# Patient Record
Sex: Female | Born: 1965 | Race: White | Hispanic: No | Marital: Single
Health system: Southern US, Community
[De-identification: ages and names within clinical notes are randomized; demographics above are authoritative.]

## PROBLEM LIST (undated history)

## (undated) DIAGNOSIS — D649 Anemia, unspecified: Secondary | ICD-10-CM

## (undated) DIAGNOSIS — N6310 Unspecified lump in the right breast, unspecified quadrant: Secondary | ICD-10-CM

## (undated) DIAGNOSIS — I241 Dressler's syndrome: Secondary | ICD-10-CM

## (undated) DIAGNOSIS — C539 Malignant neoplasm of cervix uteri, unspecified: Secondary | ICD-10-CM

## (undated) DIAGNOSIS — E785 Hyperlipidemia, unspecified: Secondary | ICD-10-CM

## (undated) DIAGNOSIS — I2510 Atherosclerotic heart disease of native coronary artery without angina pectoris: Secondary | ICD-10-CM

## (undated) DIAGNOSIS — I469 Cardiac arrest, cause unspecified: Secondary | ICD-10-CM

## (undated) DIAGNOSIS — I219 Acute myocardial infarction, unspecified: Secondary | ICD-10-CM

## (undated) DIAGNOSIS — J069 Acute upper respiratory infection, unspecified: Secondary | ICD-10-CM

## (undated) DIAGNOSIS — I2119 ST elevation (STEMI) myocardial infarction involving other coronary artery of inferior wall: Secondary | ICD-10-CM

## (undated) DIAGNOSIS — C801 Malignant (primary) neoplasm, unspecified: Secondary | ICD-10-CM

## (undated) HISTORY — DX: Unspecified lump in the right breast, unspecified quadrant: N63.10

## (undated) HISTORY — DX: Dressler's syndrome: I24.1

## (undated) HISTORY — PX: AORTIC VALVE REPLACEMENT (AVR)/CORONARY ARTERY BYPASS GRAFTING (CABG): SHX5725

## (undated) HISTORY — DX: Atherosclerotic heart disease of native coronary artery without angina pectoris: I25.10

## (undated) HISTORY — DX: Hyperlipidemia, unspecified: E78.5

## (undated) HISTORY — DX: Acute myocardial infarction, unspecified: I21.9

## (undated) HISTORY — PX: TUBAL LIGATION: SHX77

## (undated) HISTORY — PX: BREAST LUMPECTOMY: SHX2

---

## 2004-06-25 HISTORY — PX: CORONARY ARTERY BYPASS GRAFT: SHX141

## 2004-11-17 ENCOUNTER — Inpatient Hospital Stay (HOSPITAL_COMMUNITY): Admission: AD | Admit: 2004-11-17 | Discharge: 2004-12-02 | Payer: Self-pay | Admitting: Internal Medicine

## 2004-11-17 ENCOUNTER — Ambulatory Visit: Payer: Self-pay | Admitting: Cardiology

## 2004-11-17 ENCOUNTER — Ambulatory Visit: Payer: Self-pay | Admitting: Cardiovascular Disease

## 2004-12-11 ENCOUNTER — Inpatient Hospital Stay (HOSPITAL_COMMUNITY): Admission: EM | Admit: 2004-12-11 | Discharge: 2004-12-13 | Payer: Self-pay | Admitting: Emergency Medicine

## 2004-12-12 ENCOUNTER — Encounter (INDEPENDENT_AMBULATORY_CARE_PROVIDER_SITE_OTHER): Payer: Self-pay | Admitting: *Deleted

## 2004-12-12 LAB — CONVERTED CEMR LAB: Triglycerides: 120 mg/dL

## 2004-12-20 ENCOUNTER — Ambulatory Visit: Payer: Self-pay | Admitting: Cardiology

## 2005-02-16 ENCOUNTER — Ambulatory Visit: Payer: Self-pay | Admitting: Cardiology

## 2005-02-20 ENCOUNTER — Ambulatory Visit: Payer: Self-pay | Admitting: Cardiology

## 2005-10-17 ENCOUNTER — Ambulatory Visit: Payer: Self-pay | Admitting: Cardiology

## 2006-07-01 ENCOUNTER — Ambulatory Visit: Payer: Self-pay | Admitting: Cardiology

## 2006-07-05 ENCOUNTER — Ambulatory Visit: Payer: Self-pay | Admitting: Cardiology

## 2006-10-15 ENCOUNTER — Encounter: Admission: RE | Admit: 2006-10-15 | Discharge: 2006-10-15 | Payer: Self-pay | Admitting: Family Medicine

## 2007-04-02 ENCOUNTER — Ambulatory Visit: Payer: Self-pay | Admitting: Cardiovascular Disease

## 2007-04-02 ENCOUNTER — Ambulatory Visit (HOSPITAL_COMMUNITY): Admission: RE | Admit: 2007-04-02 | Discharge: 2007-04-02 | Payer: Self-pay | Admitting: Cardiovascular Disease

## 2007-04-11 ENCOUNTER — Ambulatory Visit: Payer: Self-pay | Admitting: Cardiovascular Disease

## 2007-04-11 ENCOUNTER — Ambulatory Visit (HOSPITAL_COMMUNITY): Admission: RE | Admit: 2007-04-11 | Discharge: 2007-04-11 | Payer: Self-pay | Admitting: Cardiovascular Disease

## 2008-05-19 ENCOUNTER — Ambulatory Visit: Payer: Self-pay | Admitting: Cardiology

## 2008-05-19 DIAGNOSIS — I2510 Atherosclerotic heart disease of native coronary artery without angina pectoris: Secondary | ICD-10-CM | POA: Insufficient documentation

## 2008-05-19 DIAGNOSIS — E785 Hyperlipidemia, unspecified: Secondary | ICD-10-CM

## 2008-05-26 ENCOUNTER — Ambulatory Visit (HOSPITAL_COMMUNITY): Admission: RE | Admit: 2008-05-26 | Discharge: 2008-05-26 | Payer: Self-pay | Admitting: Cardiology

## 2009-07-25 ENCOUNTER — Encounter: Payer: Self-pay | Admitting: Cardiology

## 2009-07-25 ENCOUNTER — Encounter (INDEPENDENT_AMBULATORY_CARE_PROVIDER_SITE_OTHER): Payer: Self-pay | Admitting: *Deleted

## 2009-08-05 ENCOUNTER — Ambulatory Visit: Payer: Self-pay | Admitting: Cardiology

## 2009-08-05 DIAGNOSIS — F17200 Nicotine dependence, unspecified, uncomplicated: Secondary | ICD-10-CM | POA: Insufficient documentation

## 2009-09-06 ENCOUNTER — Encounter (HOSPITAL_COMMUNITY): Admission: RE | Admit: 2009-09-06 | Discharge: 2009-10-06 | Payer: Self-pay | Admitting: Cardiology

## 2009-09-06 ENCOUNTER — Ambulatory Visit: Payer: Self-pay | Admitting: Cardiology

## 2009-09-12 ENCOUNTER — Encounter: Payer: Self-pay | Admitting: Cardiology

## 2009-11-09 ENCOUNTER — Encounter (INDEPENDENT_AMBULATORY_CARE_PROVIDER_SITE_OTHER): Payer: Self-pay | Admitting: *Deleted

## 2010-07-16 ENCOUNTER — Encounter: Payer: Self-pay | Admitting: Cardiology

## 2010-07-25 NOTE — Assessment & Plan Note (Signed)
Summary: PAST DUE FOR F/U/PT HAVING PROBLEMS/TG  Medications Added ASPIR-LOW 81 MG TBEC (ASPIRIN) take 1 tab daily      Allergies Added: NKDA  Visit Type:  Follow-up Primary Provider:  Paulene Floor, NP   History of Present Illness: 45 year old woman presents for a followup visit. She was last seen in November 2009 by Mr. Alben Spittle. She denies any progressive anginal symptoms or unusual shortness of breath. She does indicate that she had a bad "cold" a few weeks ago, during which time she had a spell of dizziness, diaphoresis, and heat. It did remind her somewhat of her remote cardiac symptoms prior to bypass surgery.  She continues to smoke cigarettes, presently at one half pack per day. She has not been able to quit. We discussed several strategies today regarding smoking cessation.  She indicates that she has had difficulty being compliant with her medications, in fact was off of all of them with the exception of aspirin, for proximally one month recently.  She has not had a recent lipid profile or liver function tests.  Current Medications (verified): 1)  Metoprolol Succinate 50 Mg Xr24h-Tab (Metoprolol Succinate) .... Take One Tablet By Mouth Daily 2)  Ramipril 2.5 Mg Caps (Ramipril) .... Take One Capsule By Mouth Daily 3)  Lipitor 20 Mg Tabs (Atorvastatin Calcium) .... Take One Tablet By Mouth Daily. 4)  Aspir-Low 81 Mg Tbec (Aspirin) .... 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  Clinical Review Panels:  Lipid Levels LDL 50  (12/12/2004) HDL 26 (12/12/2004) Cholesterol 100 (12/12/2004)  Carotid Studies Carotid Doppler Results  IMPRESSION:   Mild bilateral carotid bulb plaque without hemodynamically   significant stenosis. The exam does not exclude plaque ulceration   or embolization.  Continued surveillance recommended. (05/26/2008)    Review of Systems  The patient denies anorexia, fever, weight loss, chest pain, syncope, peripheral edema, headaches, hemoptysis, melena, hematochezia, and severe indigestion/heartburn.         Otherwise reviewed and negative.  Vital Signs:  Patient profile:   44 year old female Height:      65 inches Weight:      145 pounds BMI:     24.22 Pulse rate:   69 / minute BP sitting:   118 / 64  (right arm)  Vitals Entered By: Dreama Saa, CNA (August 05, 2009 9:46 AM)  Physical Exam  Additional Exam:  Normally nourished appearing woman in no acute distress. HEENT: Conjunctiva and lids normal, oropharynx with poor dentition. Neck: Supple, no elevated jugular venous pressure or carotid bruits. Lungs: Clear to auscultation, diminished, nonlabored. Cardiac: Regular rate and rhythm, no significant systolic murmur or S3. Abdomen: Soft, nontender, bowel sounds present. Skin: Warm and dry. Musculoskeletal: No kyphosis. Extremities: No pitting edema. Neuropsychiatric: Alert and oriented x3, affect appropriate.   EKG  Procedure date:  08/05/2009  Findings:      Sinus arrhythmia, no significant ST changes.  Impression & Recommendations:  Problem # 1:  CORONARY ATHEROSCLEROSIS NATIVE CORONARY ARTERY (ICD-414.01)  History of 2 vessel obstructive coronary artery disease status post  off-pump bypass surgery in 2006. She has not undergone interval ischemic testing since that time, and did have some vague symptoms a few weeks ago in the setting of a cold like illness. She has recently resumed her medical regimen outlined above. We discussed the importance of smoking  cessation, regular exercise, and compliance with medical therapy and followup. We also discussed proceeding with a surveillance exercise Cardiolite on medical therapy. I will plan a followup visit in 3 months for further discussion, sooner if significant abnormalities are uncovered.  Her updated medication list for this problem includes:    Metoprolol Succinate 50 Mg Xr24h-tab (Metoprolol succinate) .Marland Kitchen... Take one tablet by mouth daily    Ramipril 2.5 Mg Caps (Ramipril) .Marland Kitchen... Take one capsule by mouth daily    Aspir-low 81 Mg Tbec (Aspirin) .Marland Kitchen... Take 1 tab daily  Problem # 2:  CIGARETTE SMOKER (ICD-305.1)  Patient continues to smoke cigarettes. We discussed strategies to assist with smoking cessation, including pharmacologic measures. She will begin to work towards picking a quit date.  Problem # 3:  HYPERLIPIDEMIA (ICD-272.4)  In light of noncompliance with Lipitor, will defer on followup lipid profile and liver function tests, until she has been back on the medicine regularly for 3 months. Labs will be checked prior to her next visit.  Her updated medication list for this problem includes:    Lipitor 20 Mg Tabs (Atorvastatin calcium) .Marland Kitchen... Take one tablet by mouth daily.  Future Orders: T-Lipid Profile [id number]) ... 10/24/2009 T-Hepatic Function [phone]) ... 10/24/2009  Other Orders: Nuclear Stress Test (Nuc Stress Test)  Patient Instructions: 1)  Your physician recommends that you schedule a follow-up appointment in: 3 months 2)  Your physician recommends that you return for lab work in: 3 months 3)  Your physician has requested that you have an exercise stress myoview.  For further information please visit [email or web address]  Please follow instruction sheet, as given. 4)  Your physician recommends that you continue on your current medications as directed. Please refer to the Current Medication list given to you today.  Appended Document: Orders Update Lipids and  LFT's    Clinical Lists Changes  Orders: Added new Test order of T-Lipid Profile [phone]) - Signed Added new Test order of T-Hepatic Function [phone]) - Signed

## 2010-07-25 NOTE — Miscellaneous (Signed)
Summary: toprol,altace lipittor refill  Clinical Lists Changes  Medications: Added new medication of METOPROLOL SUCCINATE 50 MG XR24H-TAB (METOPROLOL SUCCINATE) Take one tablet by mouth daily - Signed Added new medication of RAMIPRIL 2.5 MG CAPS (RAMIPRIL) Take one capsule by mouth daily - Signed Added new medication of LIPITOR 20 MG TABS (ATORVASTATIN CALCIUM) Take one tablet by mouth daily. - Signed Rx of METOPROLOL SUCCINATE 50 MG XR24H-TAB (METOPROLOL SUCCINATE) Take one tablet by mouth daily;  #30 x 0;  Signed;  Entered by: Teressa Lower RN;  Authorized by: Loreli Slot, MD, [facility];  Method used: [id number] [address], [place], [place]  [address], Ph: [phone] or [phone], Fax: [phone] Rx of RAMIPRIL 2.5 MG CAPS (RAMIPRIL) Take one capsule by mouth daily;  #30 x 0;  Signed;  Entered by: Teressa Lower RN;  Authorized by: Loreli Slot, MD, [facility];  Method used: [facility] #[id number]*, [address], [place], [place]  [address], Ph: [phone] or [phone], Fax: [phone] Rx of LIPITOR 20 MG TABS (ATORVASTATIN CALCIUM) Take one tablet by mouth daily.;  #30 x 0;  Signed;  Entered by: Teressa Lower RN;  Authorized by: Loreli Slot, MD, [facility];  Method used: Electronically to [facility]. [id number]*, [address], [place], [place]  [address], Ph: [phone] or [phone], Fax: [phone]    Prescriptions: LIPITOR 20 MG TABS (ATORVASTATIN CALCIUM) Take one tablet by mouth daily.  #30 x 0   Entered by:   Teressa Lower RN   Authorized by:   Loreli Slot, MD, [facility]   Signed by:   Teressa Lower RN on 07/25/2009   Method used:   Electronically to        [facility] [phone]* (retail)       [address] [place]       [place], [place]  [address]       Ph: [phone] or [phone]       Fax: [phone]   RxID:   [id number] RAMIPRIL 2.5 MG CAPS (RAMIPRIL) Take one capsule by mouth daily  #30 x 0  Entered by:   Teressa Lower RN   Authorized by:   Loreli Slot, MD, [facility]   Signed by:   Teressa Lower RN on 07/25/2009   Method used:   Electronically to        [facility] [phone]* (retail)       [address] [place].       [place], [place]  [address]       Ph: [phone] or [phone]       Fax: [phone]   RxID:   [id number] METOPROLOL SUCCINATE 50 MG XR24H-TAB (METOPROLOL SUCCINATE) Take one tablet by mouth daily  #30 x 0   Entered by:   Teressa Lower RN   Authorized by:   Loreli Slot, MD, [facility]   Signed by:   Teressa Lower RN on 07/25/2009   Method used:   Electronically to        [facility] [phone]* (retail)       [address] [place]       [place], [place]  [address]       Ph: [phone] or [phone]       Fax: [phone]   RxID:   [id number]

## 2010-07-25 NOTE — Letter (Signed)
Summary: Appointment - Missed  [facility] [place]  [address], [place] [address]   Phone: [phone]  Fax: [phone]     Nov 09, 2009 MRN: [id number]   [name] [address] [place], [place]  [address]   Dear [name],  Our records indicate you missed your appointment on      11/09/09                  with Dr.  Diona Browner             It is very important that we reach you to reschedule this appointment. We look forward to participating in your health care needs. Please contact us at the number listed above at your earliest convenience to reschedule this appointment.     Sincerely,    Glass blower/designer

## 2010-07-25 NOTE — Miscellaneous (Signed)
Summary: LABS LIPIDS,12/12/2004  Clinical Lists Changes  Observations: Added new observation of LDL: 50 mg/dL (41/32/4401 02:72) Added new observation of HDL: 26 mg/dL (53/66/4403 47:42) Added new observation of TRIGLYC TOT: 120 mg/dL (59/56/3875 64:33) Added new observation of CHOLESTEROL: 100 mg/dL (29/51/8841 66:06)

## 2010-07-25 NOTE — Letter (Signed)
Summary: [place] Results Engineer, agricultural at [facility]  [address] [place] [address]   Phone: [phone]  Fax: [phone]      September 12, 2009 MRN: [id number]   SHALEE PAOLO [address] [place], [place]  [address]   Dear Ms. Laural Benes,  Your test ordered by Selena Batten has been reviewed by your physician (or physician assistant) and was found to be normal or stable. Your physician (or physician assistant) felt no changes were needed at this time.  ____ Echocardiogram  __X__ Cardiac Stress Test  ____ Lab Work  ____ Peripheral vascular study of arms, legs or neck  ____ CT scan or X-ray  ____ Lung or Breathing test  ____ Other: Please continue on current medical treatment.  Thank you.   Nona Dell, MD, F.A.C.C

## 2010-07-25 NOTE — Letter (Signed)
Summary: [place] Future Lab Work Engineer, agricultural at [facility]  [address] [place] [address]   Phone: [phone]  Fax: [phone]     August 05, 2009 MRN: [id number]   [name] [address] [place], [place]  [address]      YOUR LAB WORK IS DUE  Oct 24, 2009 _________________________________________  Please go to [facility], located across the street from [facility] on the second floor.  Hours are Monday - Friday 7am until 7:30pm         Saturday 8am until 12noon    __  DO NOT EAT OR DRINK AFTER MIDNIGHT EVENING PRIOR TO LABWORK  __ YOUR LABWORK IS NOT FASTING --YOU MAY EAT PRIOR TO LABWORK

## 2010-07-25 NOTE — Letter (Signed)
Summary: [name] (Nuc Med Stress)  [facility]  [address] [place] [address]   Phone: [phone]  Fax: [phone]    Nuclear Medicine 1-Day Stress Test Information Sheet  Re:     [name]   DOB:     15-Jun-1966 MRN:     [id number] Weight:  Appointment Date: Register at: Appointment Time: Referring MD:  _X__Exercise Stress  __Adenosine   __Dobutamine  __Lexiscan  __Persantine   __Thallium  Urgency: ____1 (next day)   ____2 (one week)    ____3 (PRN)  Patient will receive Follow Up call with results: Patient needs follow-up appointment:  Instructions regarding medication:  How to prepare for your stress test: 1. DO NOT eat or dring 6 hours prior to your arrival time. This includes no caffeine (coffee, tea, sodas, chocolate) if you were instructed to take your medications, drink water with it. 2. DO NOT use any tobacco products for at leaset 8 hours prior to arrival. 3. DO NOT wear dresses or any clothing that may have metal clasps or buttons. 4. Wear short sleeve shirts, loose clothing, and comfortalbe walking shoes. 5. DO NOT use lotions, oils or powder on your chest before the test. 6. The test will take approximately 3-4 hours from the time you arrive until completion. 7. To register the day of the test, go to the Short Stay entrance at [facility]. 8. If you must cancel your test, call [phone] as soon as you are aware.  After you arrive for test:   When you arrive at [facility], you will go to Short Stay to be registered. They will then send you to Radiology to check in. The Nuclear Medicine Tech will get you and start an IV in your arm or hand. A small amount of a radioactive tracer will then be injected into your IV. This tracer will then have to circulate for 30-45 minutes. During this time you will wait in the waiting room and you will be able to drink something without caffeine. A series of pictures will be taken  of your heart follwoing this waiting period. After the 1st set of pictures you will go to the stress lab to get ready for your stress test. During the stress test, another small amount of a radioactive tracer will be injected through your IV. When the stress test is complete, there is a short rest period while your heart rate and blood pressure will be monitored. When this monitoring period is complete you will have another set of pictrues taken. (The same as the 1st set of pictures). These pictures are taken between 15 minutes and 1 hour after the stress test. The time depends on the type of stress test you had. Your doctor will inform you of your test results within 7 days after test.    The possibilities of certain changes are possible during the test. They include abnormal blood pressure and disorders of the heart. Side effects of persantine or adenosine can include flushing, chest pain, shortness of breath, stomach tightness, headache and light-headedness. These side effects usually do not last long and are self-resolving. Every effort will be made to keep you comfortable and to minimize complications by obtaining a medical history and by close observation during the test. Emergency equipment, medications, and trained personnel are available to deal with any unusual situation which may arise.  Please notify office at least 48 hours in advance if you are unable to keep  this appt.

## 2010-10-19 ENCOUNTER — Encounter: Payer: Self-pay | Admitting: Cardiology

## 2010-10-19 ENCOUNTER — Ambulatory Visit: Payer: Self-pay | Admitting: Cardiology

## 2010-10-23 ENCOUNTER — Encounter: Payer: Self-pay | Admitting: Cardiology

## 2010-11-07 NOTE — Assessment & Plan Note (Signed)
[facility]                       [place] CARDIOLOGY OFFICE NOTE   NAME:[name], [name]                     MRN:          [id number]  DATE:04/02/2007                            DOB:          21-Jul-1965    Shine returns today for followup.  She is actually a patient of Dr.  Andee Lineman who had previously been seen in [place].  She is the sister-in-law  of one of our [address] [place].   I actually did a heart cath on the patient back in 2006.  At that time,  she presented with a subendocardial MI.  She had severe 2-vessel disease  including the LAD and a sub-totally occluded right.  She had failed  angioplasty of the right and required bypass surgery by Dr. Jaynie Collins.  She, unfortunately, has been noncompliant with her meds.  She has only  been taking an aspirin.  She also continues to smoke a pack a day.   She has been under a lot of stress lately, both in terms of neighborhood  issues, her live-in boyfriend, Brett Canales, and in general, the economy.   She has had significant chest pain.  On September 14, she had a very bad  episode with radiation down both arms, nausea, diaphoresis, and also  radiation to the back.  She thinks she had another MI.   She has had occasional chest pains outside of this as well.  Her last  Myoview was done by Dr. Andee Lineman for chest pain syndrome in January of  this year.  She had a small apical defect with partial reversibility.  He though this was stable and medical therapy was warranted.   Her overall EF was preserved at 64%.   The patient's review of systems is otherwise remarkable for no PND or  orthopnea, no exertional dyspnea.  No palpitations or syncope.   She does not have nitroglycerin to take at home.  The bad episode of  pain that she had on September 14 lasted for about 3 hours.   I had a long discussion with Steward Drone.  I told her that, given her lack of  medical compliance, her smoking, recurrent pain, and the fact that  her  circ was never bypassed, she should probably have a heart  catheterization.  She agreed.  The risk, including stroke, need for  emergency surgery, bleeding, dye reaction were discussed.  She is  willing to proceed.   We will get her blood work and chest x-ray today.   REVIEW OF SYSTEMS:  Otherwise, negative.   MEDICATIONS:  Supposed to include:  1. Toprol 25 a day.  2. Altace 2.5 a day.  3. An aspirin a day.  4. Lipitor 40 a day.   I suspect that she would not be compliant with Plavix due to the cost.   The patient is not married.  She lives with her boyfriend named Brett Canales  who has been with her for 7 years.  They have a 46-year-old daughter  named Jon Gills whom they raise.  She works at [employer] called [place] [employer] in [place].  She smokes a pack a day  and drinks  occasionally.   As indicated, she has not taken any meds for the last 2 months.  She has  no known drug allergies.   FAMILY HISTORY:  Positive for diabetes and coronary artery disease in  both sides of the family.  Both of her parents have bene dead for at  least 13 years.   EXAM:  Remarkable for a middle-aged white female in no distress.  Affect  is appropriate.  Weight is 150, respiratory rate is 14, blood pressure 140/80, pulse 74  and regular.  HEENT:  Normal.  Carotids normal without bruit.  There is no lymphadenopathy,  thyromegaly, or JVP elevation.  LUNGS:  Clear.  Good diaphoretic motion.  No wheezing.  There is an S1, S2 with normal heart sounds.  PMI is normal.  She has a  scar over the right breast from previous lumpectomy.  Bowel sounds  positive.  No tenderness.  No hepatosplenomegaly.  No hepatojugular  reflux.  Nontender.  No bruit.  Femorals are +3 bilaterally without bruit.  PT +3.  There is no lower  extremity edema.  NEURO:  Nonfocal.  There is no muscular weakness.   Her baseline EKG has nonspecific T wave changes in II, III, and F.   IMPRESSION:  1. Chest pain,  particularly bad episode on September 14.  Possible      subendocardial myocardial infarction versus angina.  Followup      catheterization to be done in [facility].  Continue aspirin.  Given the      issues with her noncompliance, it would probably not be worthwhile      to load her with Plavix.  Will see what her coronaries look like      before doing this.  2. Hyperlipidemia.  Will give her a prescription for generic      simvastatin to see if she can take this with a $10 prescription      from [facility].  We will check fasting liver and lipid profiles      prior to her heart cath.  3. Hypertension.  Currently borderline control,  Not surprising given      the lack of dietary salt restriction and not taking her Altace.      Again, we will try to give her a generic prescription for ramipril      and see if she will take this.  4. Previous history of lumpectomy in the right breast.  Need for      followup MRI or mammogram.  I have encouraged her to follow up with      [facility] for this.  5. Smoking cessation.  Again, I have told Loie that this is a real      problem.  She did not want Chantix for this.  She understands the      risk of continued smoking.   The patient seems to smoke in relation to her stress levels, which have  been high.  We will do the best we can to see if we can get her to stop  and possibly give her some generic Wellbutrin.  Further recommendations  will be based on the results of her lab work and cath.     Noralyn Pick. Eden Emms, MD, [facility]  Electronically Signed    PCN/MedQ  DD: 04/02/2007  DT: 04/02/2007  Job #: [id number]

## 2010-11-07 NOTE — Cardiovascular Report (Signed)
[name], [name]              ACCOUNT NO.:  [id number]   MEDICAL RECORD NO.:  [id number]          PATIENT TYPE:  OIB   LOCATION:  2899                         FACILITY:  [facility]   PHYSICIAN:  Denise Fells. Excell Seltzer, MD  DATE OF BIRTH:  [birth year]   DATE OF PROCEDURE:  04/11/2007  DATE OF DISCHARGE:  04/11/2007                            CARDIAC CATHETERIZATION   PROCEDURE:  1. Left heart catheterization.  2. Selective coronary angiography.  3. Left ventricular angiography.  4. Saphenous vein graft angiography.  5. Left internal mammary artery angiography.  6. Distal aortic angiography.  7. StarClose of the right femoral artery.   INDICATIONS:  [name] is a 45 year old woman with known CAD.  She  initially presented to 2006 with a non-ST-elevation MI.  She had severe  two-vessel disease and was treated with coronary bypass which involved  LIMA to LAD and saphenous vein graft to distal right coronary artery.  She has had episodes of chest discomfort that are typical of her angina  and was referred for cardiac catheterization in the setting of her known  CAD.   Risks and indications of the procedure were reviewed with the patient.  Informed consent was obtained.  The right groin was prepped, draped,  anesthetized with 1% lidocaine. Using the modified Seldinger technique,  a 6-French sheath was placed in the right femoral artery.  Standard 6-  French Judkins catheters were used for native coronary angiography.  The  JR-4 catheter was then used to image the saphenous vein graft to distal  right coronary artery as well as the left internal mammary artery.  I  had difficulty selectively engaging the left internal mammary with the  JR-4 and ultimately changed out over an exchange length wire to a 5-  [place] LIMA catheter.  Selective LIMA angiography was then performed.  An angled pigtail catheter was used to perform left ventriculography as  well as a pullback across the aortic valve.   The pigtail was then pulled  down to the abdominal aorta where a distal aortogram was performed.  At  the completion of the procedure, a StarClose device was used to seal the  femoral arteriotomy.  The patient tolerated the procedure well and had  no immediate complications.  All catheter exchanges were performed over  a guidewire.   FINDINGS:  Aortic pressure 117/62 with a mean of 86. Left ventricular  pressure is 117/8.   CORONARY ANGIOGRAPHY:  The left mainstem is angiographically normal.  It  trifurcates into the LAD, left circumflex and a ramus intermedius.   The LAD is a large-caliber vessel that courses down and reaches the LV  apex.  Proximal LAD has an eccentric 99% stenosis.  The LAD supplies a  medium-size first diagonal branch.  Just beyond the diagonal branches is  the insertion site of the LIMA to LAD.  The mid and distal LAD fill  competitively from native vessel and LIMA flow.   The intermediate branch is of medium caliber.  It is widely patent.  There is some minor nonobstructive disease in the mid portion of the  intermediate.  The left circumflex is relatively small.  There is a small first OM  branch. In the AV groove circumflex beyond the OM is an even smaller  vessel that supplies a tiny left posterolateral branch.   The right coronary artery is severely diseased.  It is a small vessel  throughout its proximal portion, and it is totally occluded in the mid  portion.  Just before the total occlusion, the right coronary supplies  two acute marginal branches to the right ventricle.   Saphenous vein graft to the distal right coronary artery is widely  patent.  There are no significant stenoses throughout the graft.  The  graft appears to supply the PDA as well as two right posterolateral  branches.  There is no significant angiographic stenosis throughout the  branch vessels of the right coronary artery.   The LIMA to LAD is widely patent. The distal to mid  distal LAD have no  significant angiographic stenosis beyond the LIMA insertion.   Left ventricular function is normal.  The LVEF is 60%.  There is no  mitral regurgitation.   Abdominal aortography demonstrates widely patent single renal arteries  bilaterally.  The distal abdominal aorta has diffuse nonobstructive  plaquing and is a relatively small vessel.  The common iliac arteries  are patent bilaterally.   ASSESSMENT:  1. Severe native two vessel coronary artery disease involving the left      anterior descending and right coronary artery.  2. Nonobstructive left circumflex stenosis.  3. Patent left internal mammary artery to left anterior descending.  4. Patent saphenous vein graft to distal right coronary artery.  5. Normal left ventricular function.  6. Mild distal abdominal aortic atherosclerosis.   PLAN:  Recommend ongoing medical therapy and tobacco cessation.  The  patient was given a prescription for Chantix to assist with smoking  cessation.      Denise Fells. Excell Seltzer, MD  Electronically Signed     MDC/MEDQ  D:  04/11/2007  T:  04/13/2007  Job:  [id number]   cc:   Denise Pick. Eden Emms, MD, [facility]

## 2010-11-07 NOTE — Assessment & Plan Note (Signed)
[facility]                       [place] CARDIOLOGY OFFICE NOTE   NAME:[name], [name]                     MRN:          [id number]  DATE:05/19/2008                            DOB:          1966-03-03    CARDIOLOGIST:  Previously was Learta Codding, MD, [facility], then Noralyn Pick.  Eden Emms, MD, [facility].  Now, [name] is established with [name].   PRIMARY CARE PHYSICIAN:  [name] is followed at [facility].   REASON FOR VISIT:  One-year followup.   HISTORY OF PRESENT ILLNESS:  [name] is a very pleasant 45 year old  female patient with a history of coronary artery disease status post  CABG in 2006 in the setting of non-ST-elevation myocardial infarction.  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  [name] underwent stress  testing in [place] in January 2008 after presenting to Dr. Andee Lineman with  chest pain.  This showed no ischemia.  [name] then saw Dr. Eden Emms in  October 2008, with another episode of chest discomfort and was referred  for cardiac catheterization.  This was done by Dr. Excell Seltzer and  demonstrated severe two-vessel CAD.  Her LAD was 99% stenosed in the  proximal section and her RCA was totally occluded in the mid section.  Her circumflex was small without significant obstruction.  Her  intermediate branch was widely patent with minor nonobstructive disease.  Her vein graft to the distal RCA and LIMA to LAD were both widely  patent.  Her EF was 60%.  [name] had some distal abdominal aorta plaquing.  Her renal arteries were patent.  [name] was treated medically.   In the office today, [name] notes that [name] is overall doing well.  [name] did  have an episode of chest discomfort about a week ago when [name] was on  vacation with a school field trip.  They were in [place], [place] and [name]  had walked for several hours prior to the episode of chest discomfort.  This was a sharp substernal pain.  There was no pressure or heaviness.  [name]  denies any associated nausea, diaphoresis, arm or jaw pain or  shortness of breath.  Her symptoms were unlike her previous angina.  [name]  has not had a recurrence since then.  [name] is quite active without any  exertional chest pain or shortness of breath.   CURRENT MEDICATIONS:  Toprol-XL 25 mg daily, Altace 2.5 mg daily,  Aspirin 81 mg daily, Lipitor 20 mg daily, Butalbital/APAP p.r.n.  headaches.   ALLERGIES:  No known drug allergies.   SOCIAL HISTORY:  [name] continues to smoke cigarettes at a pack per day.   PHYSICAL EXAMINATION:  GENERAL:  [name] is a well-nourished, well-developed  female in no acute distress.  VITAL SIGNS:  Blood pressure is 120/78, pulse 86, weight 147 pounds.  HEENT:  Normal neck without JVD.  CARDIAC:  Normal S1 and S2.  Regular rate and rhythm.  LUNGS:  Clear to auscultation bilaterally, soft, nontender.  EXTREMITIES:  Without edema.  NEUROLOGIC:  [name] is alert and oriented x3.  Cranial nerves II through  XII are grossly intact.  Patrick's exam shows a questionable carotid  bruit over the right neck.   Electrocardiogram revealed sinus rhythm with a heart rate of 86, normal  axis, nonspecific ST-T wave changes.  No significant change since  previous tracing.   ASSESSMENT AND PLAN:  1. Coronary artery disease status post non-ST elevation myocardial      infarction in 2006, followed by subsequent CABG x2 with a recent      cardiac catheterization demonstrating patent grafts and preserved      left ventricular function.  [name] had an episode of chest discomfort      recently; it was very atypical.  As noted above, [name] had a      nonischemic nuclear study last January.  I do not feel that [name]      needs further ischemic testing at this time.  We we will try to      adjust her medications by increasing her Toprol to 50 mg a day.      [name] will continue on aspirin and her other medications as listed.      We will see her back in follow up to review her symptoms.  2.  Hypertension is overall well-controlled.  [name] will continue her      medications as listed above.  3. Dyslipidemia.  Her goal LDL less than or equal to 70.  We will      follow up with fasting lipids and LFTs.  4. Questionable right carotid bruit.  We will check carotid Dopplers      and try to obtain pre CABG Dopplers done in 2006 from [facility].  5. Tobacco abuse.  I counseled her on tobacco cessation again today.      [name] is interested on Chantix and wants to call the office when [name]      is ready to start this medication.   DISPOSITION:  The patient will be brought back in followup with myself  or Dr. Diona Browner in next 1 month or sooner p.r.n. to reassess her  symptoms and make further recommendations if necessary.      Tereso Newcomer, PA-C  Electronically Signed      Jonelle Sidle, MD  Electronically Signed   SW/MedQ  DD: 05/19/2008  DT: 05/19/2008  Job #: [id number]   cc:   [facility]

## 2010-11-10 NOTE — Assessment & Plan Note (Signed)
[facility]                          [place] CARDIOLOGY OFFICE NOTE   NAME:Sopp, BREANN LOSANO                     MRN:          [id number]  DATE:07/01/2006                            DOB:          04-22-1966    HISTORY OF PRESENT ILLNESS:  The patient is a 45 year old female with a  history of coronary artery disease status post coronary artery bypass  grafting x2.  The patient underwent bypass grafting in June of 2006.  The patient, unfortunately, continues to smoke.  She has not seen a  primary care doctor in the interim.  She did take care of a right-sided  breast mass and has seen Dr. Cleotis Nipper.   She now reports left-sided chest pain that occurred after sweeping and  mopping the floor.  This happened approximately a few weeks ago.  She  states the pain lasted for approximately an hour, and she continues to  be somewhat tender to the touch.  The pain is greatly improved, however.  There is no clear exertional component.  She denies any shortness of  breath, sweatiness, or palpitations.   CURRENT MEDICATIONS:  1. Toprol XL 25 mg p.o. daily.  2. Altace 2.5 mg p.o. daily.  3. Enteric-coated aspirin 81 mg p.o. daily.  4. Lipitor 40 mg p.o. nightly.   PHYSICAL EXAMINATION:  VITAL SIGNS:  Blood pressure 124/82, heart rate  68 beats per minute, weight is 149 pounds.  NECK:  Normal carotid upstroke and no carotid bruits.  LUNGS:  Clear breath sounds bilaterally.  HEART:  Regular rate and rhythm with a normal S1, S2.  No murmurs, rubs,  or gallops.  ABDOMEN:  Soft and nontender.  No rebound tenderness or guarding.  Good  bowel sounds.  EXTREMITIES:  No cyanosis, clubbing, or edema.   A 12-lead electrocardiogram normal sinus rhythm with no acute ischemic  change.   PROBLEM LIST:  1. Coronary artery disease.      a.     Status post coronary artery bypass grafting x2 [left       internal mammary artery to the left anterior descending, saphenous  vein graft to posterior descending artery in 2006].      b.     Status post postpericardiotomy syndrome.      c.     Recurrent chest pain atypical.  2. Tobacco use.  3. Dyslipidemia.  4. Noncompliance.  5. Right breast mass, followed by Dr. Cleotis Nipper.   PLAN:  1. I counseled the patient regarding tobacco use.  She will try to      discontinue this.  2. The patient will need an adenosine Cardiolite study for further      evaluation, and I have also      ordered laboratory work, including lipid panel and LFTs.  3. The patient will follow up with [place] in 6 months.  Overall, I do      think her chest pain is probably non-cardiac.     Learta Codding, MD,FACC  Electronically Signed    GED/MedQ  DD: 07/01/2006  DT: 07/01/2006  Job #: [id number]

## 2010-11-10 NOTE — Discharge Summary (Signed)
[name], [name]              ACCOUNT NO.:  [id number]   MEDICAL RECORD NO.:  [id number]          PATIENT TYPE:  INP   LOCATION:  2038                         FACILITY:  [facility]   PHYSICIAN:  Sheliah Plane, MD    DATE OF BIRTH:  Oct 22, 1965   DATE OF ADMISSION:  11/17/2004  DATE OF DISCHARGE:                                 DISCHARGE SUMMARY   ADMISSION DIAGNOSIS:  Acute myocardial infarction.   PAST MEDICAL HISTORY/DISCHARGE DIAGNOSES:  1.  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  2.  Coronary artery disease, status post acute myocardial infarction, status      post coronary artery bypass grafting x2 off pump.  3.  Postoperative anemia, stable.   ALLERGIES:  No known drug allergies.   BRIEF HISTORY:  The patient is a 45 year old female with a long-standing  history of tobacco abuse, who presented to the emergency room at [facility] with a reported two to three-week history of episodic chest  discomfort with exertion that was nonradiating.  She then developed a  prolonged episode of substernal chest pain radiating to both arms with  exertional shortness of breath which prompted her to present to the ED.  In  the emergency room, she was found to have T wave inversions inferiorly, and  her cardiac troponins were positive.  The patient was given 600 mg of Plavix  and was started on aspirin, Lovenox, and a beta blocker, and then  transferred to [facility] for cardiac catheterization.   HOSPITAL COURSE:  The patient was admitted to [facility] on Nov 17, 2004, for cardiac catheterization.  She had been previously found in  [place] to have suffered an acute myocardial infarction.  The patient  underwent cath on Nov 17, 2004, and this revealed multivessel coronary  artery disease.  At that point, it was recommended that the patient proceed  with angioplasty to the LAD, and Plavix was continued.  However, a repeat  cardiac catheterization was performed, and  the decision was made to abandon  the idea of angioplasty and consult the CVT service regarding surgical  revascularization.   Dr. Tyrone Sage of the CVT service consulted on the patient on Nov 19, 2004,  and it was his opinion that the patient should proceed with coronary artery  bypass graft surgery, however, because the patient had been loaded with  Plavix and maintained on it for several days, the patient would require a  five to seven-day waiting period before proceeding with surgery in order to  wait for Plavix washout.  In the interim, Dr. Ezzard Standing of the general surgery  service was consulted regarding the patient's right breast mass which she  stated had been found to be benign in the past, however, had been enlarging.  Dr. Ezzard Standing evaluated the patient on Nov 22, 2004, and it was his opinion  that this was likely a fibroadenoma.  He obtained the pathology report from  [facility], and this was confirmed.  This was certainly secondary to  the patient's cardiac disease and was stated to be addressed as an  outpatient.  Dr. Ezzard Standing has agreed to see the patient in six to eight weeks  after discharge.   The patient was maintained on routine hospital care and was pain-free in  anticipation of surgery while the Plavix washout occurred.   The patient was noted to have iron deficiency anemia preoperatively, and was  started on iron supplementation.  Guaiac studies were negative.   The patient was taken to the OR on November 28, 2004, for an off-pump coronary  artery bypass grafting x2.  Endoscopic vessel harvesting was performed on  the right lower extremity.  The left internal mammary artery was grafted to  the LAD, and saphenous vein was grafted to the PDA.  The patient tolerated  the procedure well and was hemodynamically stable immediately  postoperatively.  The patient was transferred from the OR to the SICU in a  stable condition.  The patient was extubated without complication and  woke  up from anesthesia neurologically intact.   On postoperative day #1, the patient had a mild temperature of 100.  She was  awake and alert without complaint.  She was maintained in normal sinus  rhythm and had stable vital signs.  Invasive lines and chest tubes were  discontinued in a routine manner.  She tolerated this well.  The patient has  been ambulated postoperatively and has been diuresed accordingly.  She began  cardiac rehab on postoperative day #1, and has increased her tolerance to a  satisfactory level.   The patient has been anemic postoperatively as well.  On postoperative day  #2, she was noted to have a hemoglobin of 7.4.  She has been continued on  iron and folic acid supplementation.  This is being followed closely.  On  postoperative day #3, the patient is in a stable condition.  She has  maintained a normal sinus rhythm and is afebrile.  Her lungs are clear, and  the wounds are healing well.  The patient is still requiring oxygen via  nasal cannula.  Her saturations are good at this time.  The patient is in a  stable condition at this time.  As long as she continues to progress in the  current manner, she will be ready for discharge in the next one to two days  pending morning round reevaluation, anemia stabilization, and  discontinuation of oxygen.   LABORATORY DATA:  CBC and BMP on November 30, 2004:  Hemoglobin 7.4, hematocrit  23.4, white count 6.9, platelets 181,000.  Sodium 137, potassium 4.1, BUN 6,  creatinine 0.7, glucose 122.   CONDITION ON DISCHARGE:  Improved.   DISCHARGE MEDICATIONS:  1.  Aspirin 81 mg daily.  2.  Toprol-XL 25 mg daily.  3.  Altace 2.5 mg daily.  4.  Lipitor 20 mg daily.  5.  Plavix 75 mg daily.  6.  Folic acid 1 mg daily.  7.  Iron 325 mg b.i.d.  8.  Tylox 1-2 q.4-6h. p.r.n. for pain.   ACTIVITY:  No driving.  No lifting more than 10 pounds.  The patient should continue daily breathing and walking exercises.   DIET:  Low  salt, low fat.   WOUND CARE:  The patient may shower daily and clean the incisions with soap  and water.  If wound problems arise, she should contact the CVT office at  [phone].   FOLLOW-UP APPOINTMENTS:  1.  With Dr. Andee Lineman.  She will be instructed to contact his office for an      appointment two weeks  after discharge.  A chest x-ray will be taken at      that time, which the patient will bring with her to the follow-up      appointment with Dr. Tyrone Sage.  2.  With Dr. Tyrone Sage on December 28, 2004, at 12 p.m.  3.  Dr. Ezzard Standing.  The patient should contact his office for an appointment in      six to eight weeks after discharge for followup of right breast      fibroadenoma.       AY/MEDQ  D:  12/01/2004  T:  12/02/2004  Job:  [id number]   cc:   Learta Codding, M.D. [facility]   Sheliah Plane, MD  [address]  [place]  [place] [address]  Email: [email or web address] Boone. Ezzard Standing, M.D.  [address]  [place]  [place] [address]

## 2010-11-10 NOTE — Op Note (Signed)
[name], [name]              ACCOUNT NO.:  [id number]   MEDICAL RECORD NO.:  [id number]          PATIENT TYPE:  INP   LOCATION:  2038                         FACILITY:  [facility]   PHYSICIAN:  Sheliah Plane, MD    DATE OF BIRTH:  01/12/1966   DATE OF PROCEDURE:  11/28/2004  DATE OF DISCHARGE:                                 OPERATIVE REPORT   PREOPERATIVE DIAGNOSIS:  Coronary occlusive disease.   POSTOPERATIVE DIAGNOSIS:  Coronary occlusive disease.   SURGICAL PROCEDURE:  Off-pump coronary artery bypass grafting x 2, with the  left internal mammary to the left anterior descending coronary artery, and  reverse saphenous vein graft to the posterior descending coronary artery,  right Endovein harvesting.   SURGEON:  Sheliah Plane, MD.   FIRST ASSISTANT:  Stephanie Acre. Dominick, P.A.   BRIEF HISTORY:  The patient is a 45 year old female who had been placed on  Plavix in preparation for angioplasty.  She underwent cardiac  catheterization, and decision by cardiology was to proceed with angioplasty  of the LAD.  At a separate setting, angioplasty attempt was started.  However, after repeat catheterization it was decided that technically this  was not suitable, and a surgical consultation was obtained.  The patient had  total occlusion of the right coronary artery and high-grade proximal LAD  stenosis.  The circumflex was relatively free of disease.  Coronary artery  bypass grafting was recommended to the patient.  Because she had been loaded  and maintained on Plavix, surgery was delayed for Plavix washout.  The  patient agreed and signed informed consent.  There was some question of  subclavian stenosis in the patient.  CT scan of this showed some  calcification in the proximal left subclavian, but no evidence of high-grade  stenosis.   DESCRIPTION OF THE PROCEDURE:  With Swan-Ganz and arterial line monitors in  place, the patient underwent general endotracheal anesthesia  without  incident.  The skin of the chest and legs was prepped with Betadine and  draped in the usual sterile manner.  Using the Guidant Endovein harvesting  system, vein was harvested from the right thigh and was of good quality and  caliber.  Median sternotomy was performed.  Left internal mammary artery was  dissected down as a pedicle graft.  The distal artery was divided and had  good free flow.  Pericardium was opened.  Overall ventricular function  appeared preserved.  Using the Guidant stabilization devices, it was decided  that satisfactory hemodynamics could be obtained and exposure to perform the  procedure off pump.  The patient was systemically heparinized.  Using the  octopus, the heart was slightly elevated.  The LAD was identified.  Retractor tapes were placed proximally and distally on the area of  anticipated anastomosis.  A Beaver blade was used to open the artery.  Retractor tapes controlled bleeding.  Using a running 8-0 Prolene, the left  internal mammary artery was anastomosed to the left anterior descending  coronary artery.  The bulldog was reduced on the mammary artery, and  retractor tapes were removed.  Site of anastomosis  was free of bleeding.  The fascia was tacked to the epicardium.  The retractors were then removed  and repositioned to allow exposure of the proximal part of the posterior  descending coronary artery.  This vessel was encircled with retractor tapes  also. The vessel was opened and was small, 1.2-1.3 mm in size.  Using a  running 7-0 Prolene, distal anastomosis was performed.  A partial occlusion  clamp was placed on the ascending aorta.  A single punch aortotomy was  performed, and the vein graft to the posterior descending coronary artery  was anastomosed to the ascending aorta.  Air was evacuated from the grafts  as the partial occlusion clamp was removed.  Sites of anastomosis were  inspected and free of bleeding.  Ventricular and atrial  pacing wires were  applied.  Graft marker was applied.  The patient had the pericardium  reapproximated.  Two Silastic mediastinal tubes were left in place.  Sternum  was closed with #6 stainless steel wire.  Fascia was closed with interrupted  0 Vicryl, running 3-0 Vicryl in the subcutaneous tissue, 4-0 subcuticular  stitch in the skin edges.  Dry dressings were applied.  Sponge and needle  count was reported as correct at the completion of the procedure.  The  patient tolerated the procedure without obvious complication and was  transferred to the surgical intensive care unit for further postoperative  care.       EG/MEDQ  D:  11/29/2004  T:  11/29/2004  Job:  [id number]   cc:   Salvadore Farber, M.D. [facility]  [address]  [address]  [place]  [place] [address]

## 2010-11-10 NOTE — Cardiovascular Report (Signed)
[name], [name]              ACCOUNT NO.:  [id number]   MEDICAL RECORD NO.:  [id number]          PATIENT TYPE:  INP   LOCATION:  2021                         FACILITY:  [facility]   PHYSICIAN:  Charlton Haws, M.D.     DATE OF BIRTH:  Nov 18, 1965   DATE OF PROCEDURE:  11/17/2004  DATE OF DISCHARGE:                              CARDIAC CATHETERIZATION   PROCEDURE:  Left coronary angiography.   INDICATIONS:  Unstable angina, multiple coronary risk factors, __________  catheterization done from the right femoral artery.   ANGIOGRAPHY:  1.  Left main coronary artery was normal.  2.  Left anterior descending artery had a 90% discrete stenosis in the      proximal portion, distal vessel had 30% __________ lesions.  3.  Circumflex coronary artery was normal.  4.  The right coronary artery was small and 100% occlusion in the mid      vessel.  5.  There were significant left-to-right collaterals.   RIGHT VENTRICULOGRAPHY:  Right ventriculography showed minimal apical  hypokinesis.  The EF was 50-55%.  There was no gradient across the aortic  valve and no MR.  Aortic pressure was 120/70.  LV pressure was 120/14.   IMPRESSION:  The films will be reviewed with Dr. Samule Ohm. I suspect that he  will proceed with angioplasty and stenting of the proximal LAD.      PN/MEDQ  D:  11/17/2004  T:  11/18/2004  Job:  [id number]

## 2010-11-10 NOTE — Cardiovascular Report (Signed)
[name], WOODHEAD              ACCOUNT NO.:  [id number]   MEDICAL RECORD NO.:  [id number]          PATIENT TYPE:  INP   LOCATION:  2021                         FACILITY:  [facility]   PHYSICIAN:  Salvadore Farber, M.D. LHCDATE OF BIRTH:  1965-08-01   DATE OF PROCEDURE:  11/21/2004  DATE OF DISCHARGE:                              CARDIAC CATHETERIZATION   PROCEDURE:  Coronary angiography.   INDICATION:  Ms. Iacobucci is a 45 year old lady who presented with non ST  elevation myocardial infarction.  On May 26, she underwent diagnostic  angiography by Dr. Eden Emms.  This demonstrated the culprit lesion to be a 95%  stenosis of the proximal LAD.  She also had either subtotal occlusion of  total occlusion of the RCA with left-to-right collaterals.  Images of the  RCA were suboptimal due to damping of the catheter.  As my recommendation  for coronary artery bypass graft versus PCI hinged in large part upon the  ability to revascularize the RCA percutaneously, she is brought back for  repeat coronary angiography to further assess the RCA lesion.   PROCEDURAL TECHNIQUE:  Informed consent was obtained.  Under 1% lidocaine  local anesthesia, a 5 French sheath was placed in the right common femoral  artery using the modified Seldinger technique.  Diagnostic angiography was  performed using Williams right and JR-4 catheters.  The patient tolerated  the procedure well and was transferred to the holding room in stable  condition.   COMPLICATIONS:  None.   FINDINGS:  The RCA has a long, chronic total occlusion.  There is a segment  of at least 15 mm that is totaled.  There is then what appears to  be at  least diffuse disease with another area of occlusion over a segment of  approximately 50-60 mm.  I think it quite likely that this entire segment is  occluded with simply some neovascularization of the adventitia.  The LAD  lesion remains stable at 95%.   IMPRESSION/RECOMMENDATIONS:  Due to her  long chronic total occlusion of the  RCA which is not amenable to percutaneous intervention, and probable benefit  of LIMA over proximal LAD stenting, I have recommended coronary artery  bypass graft.      WED/MEDQ  D:  11/21/2004  T:  11/21/2004  Job:  [id number]   cc:   Learta Codding, M.D. [facility]

## 2010-11-10 NOTE — Consult Note (Signed)
[name], [name]              ACCOUNT NO.:  [id number]   MEDICAL RECORD NO.:  [id number]          PATIENT TYPE:  INP   LOCATION:  2021                         FACILITY:  [facility]   PHYSICIAN:  Sandria Bales. Ezzard Standing, M.D.  DATE OF BIRTH:  1965/07/10   DATE OF CONSULTATION:  11/22/2004  DATE OF DISCHARGE:                                   CONSULTATION   REASON FOR CONSULTATION:  Right breast mass.   HISTORY OF PRESENT ILLNESS:  This is a 45 year old white female who is seen  at [facility], though she cannot remember the name  of a primary care physician she sees there.  She apparently presented to  [facility] with chest pain and was felt to have a non-ST elevation  myocardial infarction.  She has now come down to [place], [place],  for further evaluation of her cardiac disease and has been evaluated.  Because of her long chronic total occlusion of her RCA, it was felt she  would be best served by having coronary artery bypass graft.   She is seen by Sheliah Plane, M.D.  She will be scheduled for this in five  to seven days after stopping Plavix.  She is tentatively scheduled for November 28, 2004.   She has a history of right breast disease.  About 10 years ago, she  underwent some biopsies of her right breast, which she was told were benign.  Then last year, about 2005, at [facility], she had a mammogram and  ultrasound and a biopsy was taken of a right breast mass that you could  feel.  She was told this was benign and not to worry about it unless it got  bigger.   I am now consulted because of the right breast mass.  I have no tissue  pathology at the time of this dictation.  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   ALLERGIES:  She has no allergies.   MEDICATIONS:  She was on no medicines.   REVIEW OF SYSTEMS:  PULMONARY:  She does smoke cigarettes, about a pack  or  two a day for 25-30 years.  CARDIAC:  She has had no prior chest pain, angina or cardiac evaluation.  GASTROINTESTINAL:  No history of peptic ulcer disease, liver disease or  change in bowel habits.  UROLOGIC:  No kidney stones or kidney infections.   SOCIAL HISTORY:  She lives with a boyfriend that she has had for some 14  years.  She is not employed outside of the house, taking care of  grandchildren.   PHYSICAL EXAMINATION:  VITAL SIGNS:  Her blood pressure is 120/71, heart  rate 67 and temperature 97.3 degrees.  GENERAL APPEARANCE:  She is a well-nourished, white female.  Alert and  cooperative on physical exam.  HEENT:  Unremarkable.  NECK:  Supple.  I feel no mass.  No thyromegaly.  LUNGS:  Clear to auscultation.  HEART:  Regular rate and rhythm.  I hear no murmur or rub.  BREASTS:  She has a 4 cm, mobile mass in the upper outer quadrant of her  right breast.  Without dimpling.  Without nipple discharge.  I feel no  supraclavicular or axillary adenopathy.  EXTREMITIES:  She has good strength in the upper and lower extremities.  NEUROLOGIC:  Grossly intact.   LABORATORY DATA:  Laboratories that I have show a sodium of 137, potassium  3.5, chloride 109, CO2 24, glucose 103 and creatinine 0.7.  Hemoglobin 10.5,  hematocrit 32.6, white blood cell count 8900.   Chest x-ray was negative.  They do see an opacity at the right base of her  lung that they thought was probably anterior right sixth rib.   IMPRESSION:  1.  Right breast mass.  By her history and physical exam, it seems like a      fibroadenoma.  I will try to get pathology from [facility].  If      it proves to be fibroadenoma, she can be seen six to eight weeks later      after her cardiac surgery is resolved and we can work this up at a later      day.  If we cannot find pathology, then I guess the question is would it      change her current status or plan for surgery, which I doubt it would.      Heart  disease would overtake any suspicious breast mass.  So at this      time, I will try to obtain the pathology from [facility] or      [facility]. If this cannot be obtained, then      consider breast ultrasound, mammogram and biopsy as needed at the [facility] to characterize the lesion.  Otherwise I will see her back in the      office in six to eight weeks.  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  Coronary artery disease with planned coronary artery bypass graft by Dr.      Tyrone Sage next week.  2.  History of smoking cigarettes, which she is trying to quit.       DHN/MEDQ  D:  11/22/2004  T:  11/22/2004  Job:  [id number]   cc:   Salvadore Farber, M.D. [facility]  [address]  [address]  [place]  [place] [address]   Sheliah Plane, MD  [address]  [place]  [place] [address]  Email: [email or web address]   Learta Codding, M.D. [facility]

## 2010-11-10 NOTE — Consult Note (Signed)
[name], [name]              ACCOUNT NO.:  [id number]   MEDICAL RECORD NO.:  [id number]          PATIENT TYPE:  INP   LOCATION:  2021                         FACILITY:  [facility]   PHYSICIAN:  Sheliah Plane, MD    DATE OF BIRTH:  08/20/1965   DATE OF CONSULTATION:  11/19/2004  DATE OF DISCHARGE:                                   CONSULTATION   REQUESTING PHYSICIAN:  Dr. Salvadore Farber, M.D. [facility].   FOLLOW-UP CARDIOLOGIST:  Dr. Andee Lineman.   PRIMARY CARE PHYSICIAN:  [facility].   REASON FOR CONSULTATION:  Acute myocardial infarction, question bypass  surgery.   HISTORY OF PRESENT ILLNESS:  Patient is a 45 year old female with a long  history of tobacco use, who presents with an approximately 2-3 week history  of episodic chest discomfort with exertion, nonradiating, on Thursday, May  25.  She developed a prolonged episode of substernal chest pain radiating to  both arms with exertional shortness of breath.  Because of the prolonged  pain, she was seen in the hospital at [facility] in [place].  There were T wave inversions inferiorly, and cardiac troponins were positive  with a most recent troponin of 0.84.  She was given 600 mg of Plavix,  started on aspirin, Lovenox, and beta blocker and transferred to [facility].  On Friday, May 26th, she underwent cardiac catheterization.  At  that point, it was the recommendation of the cardiologist to proceed with  angioplasty of her LAD.  Plavix was continued, and she was scheduled for  angioplasty today.  A repeat cardiac catheterization was done today, and it  was decided to refer the patient for surgery.   She has no prior medical history and no previous history of angioplasty.  No  previous cardiac surgery.  She has no known hypertension.  Denies diabetes.   FAMILY HISTORY:  Patient's mother died at age 71 with congestive heart  failure and diabetes.  Patient's father died several months later  at age 71  with emphysema.  Patient denies any previous stroke.  Denies claudication.  Denies renal insufficiency.  She is a smoker, having smoked up to one pack  per day x25 years.   PAST MEDICAL HISTORY:  Significant for a right breast mass.  A needle biopsy  was done of this approximately one year ago.  Patient was told that it was  benign; however, she has noted continuing enlargement of the right upper  outer quadrant breast mass.   PAST SURGICAL HISTORY:  Right breast biopsy x2, more than 10 years ago.   SOCIAL HISTORY:  Patient lives with her boyfriend of 14 years and is taking  care of her grandkids.  She is not employed outside the house.   DRUG ALLERGIES:  None.   Medications prior to admission were none.  She is currently on 325 mg of  aspirin, 75 mg of Plavix, both of which she got today.  Lopressor, Zocor.   REVIEW OF SYSTEMS:  CARDIAC:  Positive for chest pain and exertional  shortness of breath.  Denies presyncope, syncope, palpitations,  lower  extremity edema, or orthopnea.  GENERAL:  Other than fatigue, she denies any  constitutional symptoms.  RESPIRATORY:  She has had a history of hemoptysis  more than five years ago.  GASTROINTESTINAL:  No change in bowel habits.  Denies any right upper quadrant or other gallbladder symptoms.  Denies any  trouble with urination.  No change in bowel habits.  No blood in her stool.  Denies psychiatric history.  HEENT:  Denies amaurosis or _________.   PHYSICAL EXAMINATION:  VITAL SIGNS:  Blood pressure 124/48, heart rate 70,  O2 sats 100%, temperature 97.8.  She is 5 feet 4 inches tall.  Weight 145  pounds.  HEENT:  Pupils are equal and reactive to light.  NECK:  Without carotid bruits.  LUNGS:  Clear bilaterally.  CHEST:  She has an approximately 4 cm firm, mobile mass in the right upper  outer quadrant of the breast without dimpling.  There is no palpable  cervical, supraclavicular, or axillary lymph nodes.  SKIN:  Without  ischemic changes.  EXTREMITIES:  Lower extremities are 2+ DPPT pulses bilaterally.   Cholesterol is 177, HDL 22, LDL 136.  DLDL is 19.  Glucose 103.   Cardiac catheterization films are reviewed.  Both the catheterization from  Friday, 26th, and also from Monday, 30th.  Patient has totally occluded  right with collateral filling.  The PDA is a small vessel.  LV function is  preserved.  She has a 90% proximal LAD lesion.   IMPRESSION:  With the failure of attempted angioplasty, patient will be  considered a coronary artery bypass candidate with vessels to the distal  right coronary artery/posterior descending artery, and the left main to the  left anterior descending artery.  These findings were discussed with the  patient, including the risks and options, the risks of surgery, including  death, infection, stroke, myocardial infarction, bleeding, blood  transfusion, were all discussed with the patient in detail.  She is willing  to proceed.  Unfortunately, the patient has been maintained on high-dose  Plavix and will need to wait for Plavix wash-out between 5 and 7 days before  proceeding with surgery.  Tentatively, surgery would be scheduled for  Tuesday, June 6th.      EG/MEDQ  D:  11/21/2004  T:  11/21/2004  Job:  [id number]

## 2010-11-10 NOTE — H&P (Signed)
[name], [name]              ACCOUNT NO.:  [id number]   MEDICAL RECORD NO.:  [id number]          PATIENT TYPE:  INP   LOCATION:  1828                         FACILITY:  [facility]   PHYSICIAN:  Arturo Morton. Riley Kill, M.D. [employer] OF BIRTH:  1965/06/29   DATE OF ADMISSION:  12/11/2004  DATE OF DISCHARGE:                                HISTORY & PHYSICAL   PRIMARY CARE PHYSICIAN:  [facility].   PRIMARY CARDIOLOGIST:  Primary cardiologist: Learta Codding, M.D.   PATIENT PROFILE:  A 45 year old white female recently status post CABG x 2  who represents to the ED with chest pain.   PROBLEMS AND PAST MEDICAL HISTORY:  1.  Coronary artery disease.      1.  Status post non-ST-elevation myocardial infarction Nov 17, 2004.      2.  Nov 17, 2004, coronary catheterization relatively normal.  LAD 90%          proximal left circumflex normal, RCA 100% mid, EF 50 to 55%.      3.  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          Tyrone Sage with LIMA to LAD, vein graft to PDA.  2.  Tobacco abuse with 25-pack-year history, quit in May 2006.  3.  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  4.  postoperative anemia following CABG.   HISTORY OF PRESENT ILLNESS:  A 45 year old white female with history of CAD,  recently status post non-ST-elevation myocardial infarction on June 2006  with subsequent CABG x 2  with LIMA to LAD, vein graft to PDA November 28, 2004.  She is discharged December 01, 2004.  Discharge sheet had some retrosternal chest  wall pain relieved with p.r.n. narcotic, Tylox, she recently ran out of.  This morning she awoke at 6 a.m. with 7/10 sharp chest pain with some  radiation down her left arm associated with shortness of breath. Symptoms  were worse with deep breathing and not worsened with exertion.  Persisted x  5 hours before she called EMS at approximately 11 a.m.  Notably  symptoms  were different from previous angina and was described as substernal chest  heaviness.  Upon EMS arrival, she was treated with nitroglycerin sublingual,  4 baby aspirin, and morphine with minimal relief of symptoms.  ECG on  arrival to the ER showed minimal, 0.5 to 1 mm ST segment depression with T  wave inversion in leads V1 through V5.  Without additional  treatment, she  became pain free at  approximately 2 o'clock this afternoon, 8 hours after  onset of symptoms.  Her first set of cardiac markers were negative.   ALLERGIES:  No known drug allergies.   CURRENT MEDICATIONS:  1.  Aspirin 81 mg daily.  2.  Toprol XL 25 mg daily.  3.  Altace 2.5 mg daily.  4.  Lipitor 20 mg daily.  5.  Plavix 75 mg daily.  6.  Folic acid 1 mg daily.  7.  Iron sulfate 25 mg b.i.d.   FAMILY HISTORY:  Her mother died of heart failure and diabetes at age 28.  Father died of emphysema at age 47.   SOCIAL HISTORY:  She lives in [place] with a boyfriend.  She smoked about a  pack a day for 25 years and quit on her last admission.  No alcohol or  drugs.  She does not get any exercise, has been walking some around the  house since her surgery.   REVIEW OF SYSTEMS:  Positive chest pain and shortness of breath.  All other  systems reviewed and are negative.   PHYSICAL EXAMINATION:  VITAL SIGNS:  Temperature 98.2, heart rate 80,  respirations 18, blood pressure 131/76, pulse oximetry 100% on 2 liters O2.  GENERAL:  Pleasant white female in no acute distress.  Alert and oriented x  3.  NECK:  Normal carotid upstrokes.  No bruits, JVD.  LUNGS:  Respirations regular and unlabored with diminished breath sounds in  bilateral bases, otherwise clear to auscultation.  CARDIAC:  Regular S1, S2, no S3, S4.  No murmurs.  ABDOMEN:  Round, soft, nontender, nondistended.  Bowel sounds x 4.  No  hepatosplenomegaly.  EXTREMITIES:  Warm, dry, pink.  No clubbing, cyanosis, or edema. Dorsalis  pedis and posterior  tibialis +2 pulses bilaterally.  SKIN:  Her mid sternal incision as well as bilateral ___________ chest tube  incision sites appear to be healing well without any discharge.   Chest x-ray from today shows improving bibasilar atelectasis and small left  effusion.   ECG shows heart rate of 66 with sinus rhythm, normal axis, 0.5 to 1 mm ST  segment depression, T wave inversions in V1 through V5.   CK-MB less than _________.  Troponin I is 0.05.  Sodium 142, potassium 3.7,  chloride 109, BUN 5,CO2 24, creatinine 0.6.  CBC is pending.   ASSESSMENT AND PLAN:  1.  Chest pain with history of coronary artery disease.  The patient      presents with chest pain occurring ___________ for a totally of      approximately 8 hours ___________ 2 p.m. prior to relief.  Symptoms were      worse with deep breathing.  She is now pain free at rest.  Her pain is      felt to be reproduced with deep breathing.  ECG with minimal ST      depression and T wave inversions V1 through V5.  On check of cardiac      enzymes, the first set is negative.  Add Lovenox, aspirin, Plavix, ACE      inhibitor, and beta blocker as she is currently taking a home.  Check a      D-dimer and if this is positive, will certainly get a chest CT to rule      out pulmonary embolus.  We will discuss with Dr. Riley Kill and consider      functional study versus catheterization in the a.m. provided that D-      dimer and chest CT are negative.  2.  Hyperlipidemia.  Check LFTs and fasting lipid profile.  Continue      statin.  3.  Tobacco abuse.  The patient quit after CABG.  Continue cessation.  4.  Fibroadenoma of the right foot.  This is to be followed by general      surgery as an outpatient.       CRB/MEDQ  D:  12/11/2004  T:  12/11/2004  Job:  [id number]   cc:   Learta Codding, M.D. [facility]  [address]  [address]  [place]  [place] [address]

## 2010-11-10 NOTE — Discharge Summary (Signed)
NAMEAVENLY, ROBERGE              ACCOUNT NO.:  [id number]   MEDICAL RECORD NO.:  [id number]          PATIENT TYPE:  INP   LOCATION:  4740                         FACILITY:  [facility]   PHYSICIAN:  Charlies Constable, M.D. [facility] DATE OF BIRTH:  1966/03/10   DATE OF ADMISSION:  12/11/2004  DATE OF DISCHARGE:  12/13/2004                                 DISCHARGE SUMMARY   PROCEDURE:  CT angiogram of the chest.   DISCHARGE DIAGNOSES:  1.  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  2.  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  3.  History of right breast mass, fibroadenoma.  4.  Thirty-pack-year history of tobacco use.  5.  Post bypass surgery anemia.  6.  Elevated D-dimer with a CT of the chest negative for PE.  7.  Elevated CKMB with a normal CK.  8.  Dyslipidemia with a total cholesterol of 100 and triglycerides 120, HDL      26, LDL 50.  9.  Post bypass surgery anemia that is also iron deficient with an iron      level of 32.  10. Elevated D-dimer at 3.47 with a chest CT negative for PE this admission.  11. Dyslipidemia with a total cholesterol of 100, triglycerides 120, HDL 26,      LDL 50 this admission.   HOSPITAL COURSE:  Ms. Arnitra Sokoloski is a 45 year old female who recently  had bypass surgery. She was discharged on December 01, 2004. On December 11, 2004,  she began having retrosternal chest pain. She had run out of her discharge  narcotics. She came to the emergency room and was admitted for further  evaluation and treatment.   Her symptoms were worse with deep breathing. Her D-dimer was elevated but CT  of the chest was negative. She had elevations in her CKMBs with a peak of  27/10.4. Troponin peak was 0.08. Her symptoms responded well to pain control  medications.   Dr. Juanda Chance evaluated her and felt that this was possibly Dressler syndrome  and started NSAIDs. She responded well to this. On December 13, 2004, she was  feeling much  better. Dr. Juanda Chance evaluated her and felt that she could be  discharged with outpatient follow-up as previously arranged.   DISCHARGE MEDICATIONS:  1.  Aspirin 91 mg q.d.  2.  Toprol XL 25 mg q.d.  3.  Altace 2.5 mg q.d.  4.  Lipitor 20 mg q.d.  5.  Plavix 75 mg q.d.  6.  Folic acid 1 mg q.d.  7.  Iron 325 mg b.i.d.  8.  Voltaren 75 mg b.i.d.  9.  Darvocet-N 100 p.r.n.      Hermenia Bers   RB/MEDQ  D:  12/13/2004  T:  12/13/2004  Job:  [id number]   cc:   Learta Codding, M.D. [facility]   [facility]   Sheliah Plane, MD  [address]  [place]  [place] [address]  Email: [email or web address]

## 2010-12-08 ENCOUNTER — Ambulatory Visit: Payer: Self-pay | Admitting: Cardiology

## 2010-12-21 ENCOUNTER — Ambulatory Visit: Payer: Self-pay | Admitting: Cardiology

## 2014-01-17 ENCOUNTER — Inpatient Hospital Stay (HOSPITAL_COMMUNITY)
Admission: EM | Admit: 2014-01-17 | Discharge: 2014-01-18 | DRG: 760 | Disposition: A | Discharge status: Home / Self Care | Payer: 59 | Attending: Obstetrics & Gynecology | Admitting: Obstetrics & Gynecology

## 2014-01-17 ENCOUNTER — Encounter (HOSPITAL_COMMUNITY): Payer: Self-pay | Admitting: Emergency Medicine

## 2014-01-17 DIAGNOSIS — N939 Abnormal uterine and vaginal bleeding, unspecified: Secondary | ICD-10-CM

## 2014-01-17 DIAGNOSIS — I2510 Atherosclerotic heart disease of native coronary artery without angina pectoris: Secondary | ICD-10-CM | POA: Diagnosis present

## 2014-01-17 DIAGNOSIS — Z23 Encounter for immunization: Secondary | ICD-10-CM | POA: Diagnosis not present

## 2014-01-17 DIAGNOSIS — E43 Unspecified severe protein-calorie malnutrition: Secondary | ICD-10-CM | POA: Diagnosis present

## 2014-01-17 DIAGNOSIS — N920 Excessive and frequent menstruation with regular cycle: Secondary | ICD-10-CM | POA: Diagnosis present

## 2014-01-17 DIAGNOSIS — D62 Acute posthemorrhagic anemia: Secondary | ICD-10-CM

## 2014-01-17 DIAGNOSIS — F17200 Nicotine dependence, unspecified, uncomplicated: Secondary | ICD-10-CM | POA: Diagnosis present

## 2014-01-17 DIAGNOSIS — E785 Hyperlipidemia, unspecified: Secondary | ICD-10-CM | POA: Diagnosis present

## 2014-01-17 DIAGNOSIS — IMO0002 Reserved for concepts with insufficient information to code with codable children: Secondary | ICD-10-CM | POA: Diagnosis not present

## 2014-01-17 DIAGNOSIS — I252 Old myocardial infarction: Secondary | ICD-10-CM

## 2014-01-17 DIAGNOSIS — Z951 Presence of aortocoronary bypass graft: Secondary | ICD-10-CM

## 2014-01-17 DIAGNOSIS — I959 Hypotension, unspecified: Secondary | ICD-10-CM | POA: Diagnosis present

## 2014-01-17 LAB — CBC WITH DIFFERENTIAL/PLATELET
Basophils Absolute: 0 10*3/uL (ref 0.0–0.1)
Basophils Relative: 0 % (ref 0–1)
EOS ABS: 0.9 10*3/uL — AB (ref 0.0–0.7)
Eosinophils Relative: 8 % — ABNORMAL HIGH (ref 0–5)
HCT: 17.9 % — ABNORMAL LOW (ref 36.0–46.0)
HEMOGLOBIN: 5.8 g/dL — AB (ref 12.0–15.0)
LYMPHS ABS: 0.8 10*3/uL (ref 0.7–4.0)
Lymphocytes Relative: 7 % — ABNORMAL LOW (ref 12–46)
MCH: 29 pg (ref 26.0–34.0)
MCHC: 32.4 g/dL (ref 30.0–36.0)
MCV: 89.5 fL (ref 78.0–100.0)
MONO ABS: 0.4 10*3/uL (ref 0.1–1.0)
MONOS PCT: 4 % (ref 3–12)
NEUTROS PCT: 80 % — AB (ref 43–77)
Neutro Abs: 8.8 10*3/uL — ABNORMAL HIGH (ref 1.7–7.7)
Platelets: 293 10*3/uL (ref 150–400)
RBC: 2 MIL/uL — AB (ref 3.87–5.11)
RDW: 14.5 % (ref 11.5–15.5)
WBC: 10.9 10*3/uL — ABNORMAL HIGH (ref 4.0–10.5)

## 2014-01-17 LAB — BASIC METABOLIC PANEL
Anion gap: 10 (ref 5–15)
BUN: 7 mg/dL (ref 6–23)
CO2: 24 meq/L (ref 19–32)
CREATININE: 0.59 mg/dL (ref 0.50–1.10)
Calcium: 8.1 mg/dL — ABNORMAL LOW (ref 8.4–10.5)
Chloride: 107 mEq/L (ref 96–112)
GFR calc Af Amer: >90 mL/min (ref >90)
Glucose, Bld: 125 mg/dL — ABNORMAL HIGH (ref 70–99)
POTASSIUM: 4.4 meq/L (ref 3.7–5.3)
SODIUM: 141 meq/L (ref 137–147)

## 2014-01-17 LAB — HCG, SERUM, QUALITATIVE: Preg, Serum: NEGATIVE

## 2014-01-17 LAB — ABO/RH: ABO/RH(D): O POS

## 2014-01-17 LAB — PREPARE RBC (CROSSMATCH)

## 2014-01-17 MED ORDER — MEGESTROL ACETATE 40 MG PO TABS
120.0000 mg | ORAL_TABLET | Freq: Every day | ORAL | Status: DC
  Administered 2014-01-17 – 2014-01-18 (×2): 120 mg via ORAL
  Filled 2014-01-17 (×4): qty 3

## 2014-01-17 MED ORDER — HYDROCODONE-ACETAMINOPHEN 5-325 MG PO TABS
1.0000 | ORAL_TABLET | ORAL | Status: DC | PRN
  Administered 2014-01-17 – 2014-01-18 (×2): 2 via ORAL
  Filled 2014-01-17 (×2): qty 2

## 2014-01-17 MED ORDER — FUROSEMIDE 10 MG/ML IJ SOLN
20.0000 mg | Freq: Once | INTRAMUSCULAR | Status: AC
Start: 2014-01-17 — End: 2014-01-18
  Administered 2014-01-18: 20 mg via INTRAVENOUS
  Filled 2014-01-17: qty 2

## 2014-01-17 MED ORDER — FUROSEMIDE 10 MG/ML IJ SOLN
40.0000 mg | Freq: Once | INTRAMUSCULAR | Status: AC
  Administered 2014-01-17: 40 mg via INTRAVENOUS
  Filled 2014-01-17: qty 4

## 2014-01-17 MED ORDER — MEGESTROL ACETATE 40 MG PO TABS
ORAL_TABLET | ORAL | Status: AC
  Filled 2014-01-17: qty 3

## 2014-01-17 NOTE — ED Notes (Signed)
MD states to hold off on collecting urine.

## 2014-01-17 NOTE — Care Management (Signed)
ED/CM noted patient did not have health insurance and/or PCP listed in the computer.  Patient was given the [facility] resources handout with information on the clinics, food pantries, and the handout for new health insurance sign-up.  Also provided prescription drug discount card and communicated regarding possible referral for [facility] assistance if needed if patient gets discharged with prescriptions to be filled.  CM was able to provided information once patient was admitted to unit. Patient expressed appreciation for information received.

## 2014-01-17 NOTE — ED Notes (Signed)
Pt comes from home via EMS with c/o vaginal bleeding, generalized weakness, lack of appetite and hypotension. EMS reports BP 84 systolic initially. After receiving 765ml of NS pt's systolic BP increased 92. Pt is A&Ox4 and states she feels "better" after receiving fluids. Pt states vaginal bleeding has been intermittent for "over a month" but reports increase in bleeding along with clots since Wednesday.

## 2014-01-17 NOTE — Progress Notes (Signed)
Received report from [place], [place] in ER. Patient arrived to unit @ 1545. Patient was alert and oriented. Patient had no complaints and was visiting with family. Patients call light was within reach and bed was at lowest level. Blood started @ 1730 per MD order to transfuse. Lindsi, RN verified blood. Patient is tolerating transfusion with no complications at this time. Will continue to monitor patient.

## 2014-01-17 NOTE — Progress Notes (Signed)
Pt c/o aching pain in the pelvic area, rated as 4/10 on pain scale. No PRN pain medications ordered. Paged Dr Elonda Husky. Awaiting to hear from physician or for further orders. Pt resting in bed at lowest position and call bell within reach.

## 2014-01-17 NOTE — Progress Notes (Signed)
Blood Transfusion completed without any complications. Pt stated, "I feel fine". VS taken, are stable and recorded. Will record post transfusion vital signs in 1 hour and begin 2nd unit as soon as possible. Will continue to monitor pt for adverse reactions to blood transfusion. Bed in lowest position, call bell within reach. Pt sleeping at this time.

## 2014-01-17 NOTE — ED Notes (Signed)
Lab called with critical hemoglobin of 5.8. Dr Christy Gentles notified.

## 2014-01-17 NOTE — ED Notes (Signed)
Report given to Taylor, RN

## 2014-01-17 NOTE — ED Provider Notes (Signed)
CSN: [id number]     Arrival date & time 01/17/14  1322 History  This chart was scribed for [name], MD by Girtha Hake, ED Scribe. The patient was seen in [place]. The patient's care was started at 1:51 PM.     Chief Complaint  Patient presents with  . Hypotension  . Vaginal Bleeding    Patient is a 48 y.o. female presenting with vaginal bleeding. The history is provided by the patient. No language interpreter was used.  Vaginal Bleeding Quality:  Heavier than menses Severity:  Severe Duration:  5 days Timing:  Intermittent Progression:  Unchanged Chronicity:  Recurrent (last 6 months) Menstrual history:  Irregular (regular until six months ago (December 2014)) Associated symptoms: abdominal pain, dyspareunia and nausea   Associated symptoms: no dysuria and no fever    HPI Comments: [name] is a 48 y.o. female who presents to the Emergency Department complaining of vaginal bleeding beginning five days ago. She reports abnormally heavy periods beginning six months ago in December, but states that the bleeding has been more severe this time. Patient reports associated generalized weakness, nausea, SOB, and abdominal pain. She denies CP, fever, vomiting, or diarrhea.  Patient has a history of coronary artery bypass graft surgery in 2006. Patient reports that she does not see a gynecologist regularly. She does not have a PCP.  Past Medical History  Diagnosis Date  . Breast mass, right     Benign fibroid adenoma  . 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 Coronary atherosclerosis of native coronary artery     90% LAD ,100%RCA, LVEF 50-55%  . Hyperlipidemia   . Myocardial infarction     NSTEMI 5/06   Past Surgical History  Procedure Laterality Date  . Breast lumpectomy    . Coronary artery bypass graft  2006    Off pump LIMA to LAD, SVG to PDA   History reviewed. No pertinent family history. History  Substance Use Topics  . Smoking status: Current Every  Day Smoker    Types: Cigarettes  . Smokeless tobacco: Never Used  . Alcohol Use: No   OB History   Grav Para Term Preterm Abortions TAB SAB Ect Mult Living                 Review of Systems  Constitutional: Negative for fever.  Respiratory: Positive for shortness of breath.   Cardiovascular: Negative for chest pain.  Gastrointestinal: Positive for nausea and abdominal pain. Negative for vomiting and diarrhea.  Genitourinary: Positive for vaginal bleeding and dyspareunia. Negative for dysuria.  Neurological: Positive for weakness.  All other systems reviewed and are negative.     Allergies  Review of patient's allergies indicates no known allergies.  Home Medications   Prior to Admission medications   Medication Sig Start Date End Date Taking? Authorizing Provider  aspirin 81 MG tablet Take 81 mg by mouth daily.      Historical Provider, MD  atorvastatin (LIPITOR) 20 MG tablet Take 20 mg by mouth daily.      Historical Provider, MD  metoprolol (TOPROL-XL) 50 MG 24 hr tablet Take 50 mg by mouth daily.      Historical Provider, MD  ramipril (ALTACE) 5 MG capsule Take 5 mg by mouth daily.      Historical Provider, MD   BP 101/57  Pulse 90  Temp(Src) 97.7 F (36.5 C) (Oral)  Resp 18  SpO2 100% Physical Exam CONSTITUTIONAL: Well developed/well nourished HEAD: Normocephalic/atraumatic  EYES: EOMI/PERRL, conjunctiva pale ENMT: Mucous membranes moist NECK: supple no meningeal signs SPINE:entire spine nontender CV: S1/S2 noted, no murmurs/rubs/gallops noted LUNGS: Lungs are clear to auscultation bilaterally, no apparent distress ABDOMEN: soft, nontender, no rebound or guarding GU:no cva tenderness. Vaginal bleeding noted.  No CMT noted.  No signs of trauma.  Female chaperone present NEURO: Pt is awake/alert, moves all extremitiesx4 EXTREMITIES: pulses normal, full ROM SKIN: warm, pale PSYCH: no abnormalities of mood noted  ED Course  Procedures  DIAGNOSTIC  STUDIES: Oxygen Saturation is 100% on room air, normal by my interpretation.    COORDINATION OF CARE: 1:55 PM-Discussed treatment plan which includes labs, NPO diet, and pelvic cart with pt at bedside and pt agreed to plan.  2:28 PM Pt found to have HGB of 5 We discussed risk/benefits of blood transfusion and she does not have any objections to receiving blood products consulted gynecology  3:11 PM D/w dr Elonda Husky Will admit to hospital He requests lasix 40mg  between and after each unit of PRBC Pt stabilized in the ED She has no focal abd tenderness Labs Review Labs Reviewed  CBC WITH DIFFERENTIAL - Abnormal; Notable for the following:    WBC 10.9 (*)    RBC 2.00 (*)    Hemoglobin 5.8 (*)    HCT 17.9 (*)    Neutrophils Relative % 80 (*)    Neutro Abs 8.8 (*)    Lymphocytes Relative 7 (*)    Eosinophils Relative 8 (*)    Eosinophils Absolute 0.9 (*)    All other components within normal limits  BASIC METABOLIC PANEL - Abnormal; Notable for the following:    Glucose, Bld 125 (*)    Calcium 8.1 (*)    All other components within normal limits  HCG, SERUM, QUALITATIVE  TYPE AND SCREEN  PREPARE RBC (CROSSMATCH)      EKG Interpretation   Date/Time:  Sunday January 17 2014 13:51:47 EDT Ventricular Rate:  92 PR Interval:  136 QRS Duration: 79 QT Interval:  351 QTC Calculation: 434 R Axis:   96 Text Interpretation:  Sinus rhythm Borderline right axis deviation Low  voltage, precordial leads No significant change since last tracing  Confirmed by Christy Gentles  MD, Elenore Rota ([id number]) on 01/17/2014 2:00:53 PM      MDM   Final diagnoses:  Abnormal vaginal bleeding  Acute blood loss anemia    Nursing notes including past medical history and social history reviewed and considered in documentation Labs/vital reviewed and considered   I personally performed the services described in this documentation, which was scribed in my presence. The recorded information has been reviewed and is  accurate.      [name], MD 01/17/14 [phone]

## 2014-01-18 ENCOUNTER — Ambulatory Visit (INDEPENDENT_AMBULATORY_CARE_PROVIDER_SITE_OTHER): Payer: 59 | Admitting: Obstetrics & Gynecology

## 2014-01-18 ENCOUNTER — Inpatient Hospital Stay (HOSPITAL_COMMUNITY): Payer: 59

## 2014-01-18 ENCOUNTER — Encounter: Payer: Self-pay | Admitting: Obstetrics & Gynecology

## 2014-01-18 VITALS — BP 120/60 | Ht 65.0 in | Wt 115.0 lb

## 2014-01-18 DIAGNOSIS — E43 Unspecified severe protein-calorie malnutrition: Secondary | ICD-10-CM | POA: Insufficient documentation

## 2014-01-18 DIAGNOSIS — C539 Malignant neoplasm of cervix uteri, unspecified: Secondary | ICD-10-CM

## 2014-01-18 LAB — BASIC METABOLIC PANEL
Anion gap: 12 (ref 5–15)
BUN: 7 mg/dL (ref 6–23)
CO2: 26 mEq/L (ref 19–32)
CREATININE: 0.55 mg/dL (ref 0.50–1.10)
Calcium: 8.8 mg/dL (ref 8.4–10.5)
Chloride: 102 mEq/L (ref 96–112)
GFR calc Af Amer: >90 mL/min (ref >90)
GFR calc non Af Amer: >90 mL/min (ref >90)
Glucose, Bld: 93 mg/dL (ref 70–99)
Potassium: 3.8 mEq/L (ref 3.7–5.3)
Sodium: 140 mEq/L (ref 137–147)

## 2014-01-18 LAB — CBC
HEMATOCRIT: 26.3 % — AB (ref 36.0–46.0)
Hemoglobin: 9.2 g/dL — ABNORMAL LOW (ref 12.0–15.0)
MCH: 30.6 pg (ref 26.0–34.0)
MCHC: 35 g/dL (ref 30.0–36.0)
MCV: 87.4 fL (ref 78.0–100.0)
Platelets: 277 10*3/uL (ref 150–400)
RBC: 3.01 MIL/uL — ABNORMAL LOW (ref 3.87–5.11)
RDW: 14 % (ref 11.5–15.5)
WBC: 10.6 10*3/uL — ABNORMAL HIGH (ref 4.0–10.5)

## 2014-01-18 LAB — TYPE AND SCREEN
ABO/RH(D): O POS
Antibody Screen: NEGATIVE
UNIT DIVISION: 0
Unit division: 0

## 2014-01-18 MED ORDER — SODIUM CHLORIDE 0.9 % IV SOLN
1020.0000 mg | Freq: Once | INTRAVENOUS | Status: AC
  Administered 2014-01-18: 1020 mg via INTRAVENOUS
  Filled 2014-01-18: qty 34

## 2014-01-18 MED ORDER — OXYCODONE-ACETAMINOPHEN 5-325 MG PO TABS: 1.0000 | ORAL_TABLET | ORAL | Status: DC | PRN

## 2014-01-18 MED ORDER — BOOST / RESOURCE BREEZE PO LIQD
1.0000 | Freq: Three times a day (TID) | ORAL | Status: DC
  Administered 2014-01-18: 1 via ORAL

## 2014-01-18 NOTE — Discharge Summary (Signed)
Physician Discharge Summary  Patient ID: [name] MRN: [id number] DOB/AGE: 02/13/1966 48 y.o.  Admit date: 01/17/2014 Discharge date: 01/18/2014  Admission Diagnoses: Menorrhagia Anemia Discharge Diagnoses:  Active Problems:   Acute blood loss anemia   Protein-calorie malnutrition, severe   Discharged Condition: fair  Hospital Course: see notes  Consults: None  Significant Diagnostic Studies: labs: CBC  Treatments: transfusion  Discharge Exam: Blood pressure 124/58, pulse 81, temperature 98.4 F (36.9 C), temperature source Oral, resp. rate 19, height 5\' 5"  (1.651 m), weight 116 lb 13.5 oz (53 kg), SpO2 100.00%. General appearance: alert, cooperative and no distress GI: soft, non-tender; bowel sounds normal; no masses,  no organomegaly  Disposition:   Discharge Instructions   Call MD for:  persistant nausea and vomiting    Complete by:  As directed      Call MD for:  severe uncontrolled pain    Complete by:  As directed      Call MD for:  temperature >100.4    Complete by:  As directed      Diet - low sodium heart healthy    Complete by:  As directed      Increase activity slowly    Complete by:  As directed      Sexual Activity Restrictions    Complete by:  As directed   No sex            Medication List         aspirin 81 MG tablet  Take 81 mg by mouth daily.     GOODY HEADACHE PO  Take 1-2 Packages by mouth 3 (three) times daily as needed (pain).           Follow-up Information   Follow up with Florian Buff, MD Today. (for endometrial biopsy and exam)    Specialties:  Obstetrics and Gynecology, Radiology   Contact information:   [place] [address] [phone]       Signed: Florian Buff 01/18/2014, 2:44 PM

## 2014-01-18 NOTE — Addendum Note (Signed)
Addended by: Farley Ly on: 01/18/2014 05:27 PM   Modules accepted: Orders

## 2014-01-18 NOTE — Progress Notes (Signed)
UR chart review completed.  

## 2014-01-18 NOTE — Progress Notes (Signed)
2nd Unit blood transfusion completed without complications. No adverse effects noted. VSS. Pt received 10 mg Lortab earlier and now pain free. Pt laying comfortably in bed at lowest position and call bell within reach. Will continue to monitor.

## 2014-01-18 NOTE — H&P (Signed)
Chief Complaint   Patient presents with   .  Hypotension   .  Vaginal Bleeding   Patient is a 48 y.o. female presenting with vaginal bleeding. The history is provided by the patient. No language interpreter was used.  Vaginal Bleeding  Quality: Heavier than menses  Severity: Severe  Duration: 5 days  Timing: Intermittent  Progression: Unchanged  Chronicity: Recurrent (last 6 months)  Menstrual history: Irregular (regular until six months ago (December 2014)) Associated symptoms: abdominal pain, dyspareunia and nausea  Associated symptoms: no dysuria and no fever  HPI Comments:  [name] is a 48 y.o. female who presents to the Emergency Department complaining of vaginal bleeding beginning five days ago. She reports abnormally heavy periods beginning six months ago in December, but states that the bleeding has been more severe this time. Patient reports associated generalized weakness, nausea, SOB, and abdominal pain. She denies CP, fever, vomiting, or diarrhea.  Patient has a history of coronary artery bypass graft surgery in 2006. Patient reports that she does not see a gynecologist regularly. She does not have a PCP.  Past Medical History   Diagnosis  Date   .  Breast mass, right      Benign fibroid adenoma   .  Dressler's syndrome      Probable   .  Coronary atherosclerosis of native coronary artery      90% LAD ,100%RCA, LVEF 50-55%   .  Hyperlipidemia    .  Myocardial infarction      NSTEMI 5/06    Past Surgical History   Procedure  Laterality  Date   .  Breast lumpectomy     .  Coronary artery bypass graft   2006     Off pump LIMA to LAD, SVG to PDA   History reviewed. No pertinent family history.  History   Substance Use Topics   .  Smoking status:  Current Every Day Smoker     Types:  Cigarettes   .  Smokeless tobacco:  Never Used   .  Alcohol Use:  No    OB History    Grav  Para  Term  Preterm  Abortions  TAB  SAB  Ect  Mult  Living                  Review of Systems  Constitutional: Negative for fever.  Respiratory: Positive for shortness of breath.  Cardiovascular: Negative for chest pain.  Gastrointestinal: Positive for nausea and abdominal pain. Negative for vomiting and diarrhea.  Genitourinary: Positive for vaginal bleeding and dyspareunia. Negative for dysuria.  Neurological: Positive for weakness.  All other systems reviewed and are negative.  Allergies   Review of patient's allergies indicates no known allergies.  Home Medications    Prior to Admission medications   Medication  Sig  Start Date  End Date  Taking?  Authorizing Provider   aspirin 81 MG tablet  Take 81 mg by mouth daily.     Historical Provider, MD   atorvastatin (LIPITOR) 20 MG tablet  Take 20 mg by mouth daily.     Historical Provider, MD   metoprolol (TOPROL-XL) 50 MG 24 hr tablet  Take 50 mg by mouth daily.     Historical Provider, MD   ramipril (ALTACE) 5 MG capsule  Take 5 mg by mouth daily.     Historical Provider, MD   BP 101/57  Pulse 90  Temp(Src) 97.7 F (36.5 C) (Oral)  Resp 18  SpO2 100%  Physical Exam  CONSTITUTIONAL: Well developed/well nourished  HEAD: Normocephalic/atraumatic  EYES: EOMI/PERRL, conjunctiva pale  ENMT: Mucous membranes moist  NECK: supple no meningeal signs  SPINE:entire spine nontender  CV: S1/S2 noted, no murmurs/rubs/gallops noted  LUNGS: Lungs are clear to auscultation bilaterally, no apparent distress  ABDOMEN: soft, nontender, no rebound or guarding  GU:no cva tenderness. Vaginal bleeding noted. No CMT noted. No signs of trauma. Female chaperone present  NEURO: Pt is awake/alert, moves all extremitiesx4  EXTREMITIES: pulses normal, full ROM  SKIN: warm, pale  PSYCH: no abnormalities of mood noted  ED Course   Procedures  DIAGNOSTIC STUDIES:  Oxygen Saturation is 100% on room air, normal by my interpretation.  COORDINATION OF CARE:  1:55 PM-Discussed treatment plan which includes labs, NPO diet, and pelvic  cart with pt at bedside and pt agreed to plan.  2:28 PM  Pt found to have HGB of 5  We discussed risk/benefits of blood transfusion and she does not have any objections to receiving blood products  consulted gynecology  3:11 PM  D/w dr Elonda Husky  Will admit to hospital  He requests lasix 40mg  between and after each unit of PRBC  Pt stabilized in the ED  She has no focal abd tenderness  Labs Review  Labs Reviewed   CBC WITH DIFFERENTIAL - Abnormal; Notable for the following:    WBC  10.9 (*)     RBC  2.00 (*)     Hemoglobin  5.8 (*)     HCT  17.9 (*)     Neutrophils Relative %  80 (*)     Neutro Abs  8.8 (*)     Lymphocytes Relative  7 (*)     Eosinophils Relative  8 (*)     Eosinophils Absolute  0.9 (*)     All other components within normal limits   BASIC METABOLIC PANEL - Abnormal; Notable for the following:    Glucose, Bld  125 (*)     Calcium  8.1 (*)     All other components within normal limits   HCG, SERUM, QUALITATIVE   TYPE AND SCREEN   PREPARE RBC (CROSSMATCH)   EKG Interpretation  Date/Time: Sunday January 17 2014 13:51:47 EDT  Ventricular Rate: 92  PR Interval: 136  QRS Duration: 79  QT Interval: 351  QTC Calculation: 434  R Axis: 96  Text Interpretation: Sinus rhythm Borderline right axis deviation Low  voltage, precordial leads No significant change since last tracing   Impression: Menorrhagia Anemia, symptomatic, acute/chronic  Plan: Transfusion 2 units PRBC, megestrol therapy, feraheme, sonogram evaluation of enodmtrial pathology and pelvic anatomy  [name]

## 2014-01-18 NOTE — Progress Notes (Signed)
Patient ID: [name], female   DOB: 27-Apr-1966, 48 y.o.   MRN: [id number] Pt discharged and came over here for suspcious finding of cevical lesion  Biopsy done and sent to path  Pt informed and will make arrangements with gyn oncologist Pt understands  Rx percocet

## 2014-01-18 NOTE — Progress Notes (Signed)
Patient states understanding of discharge instructions.  

## 2014-01-18 NOTE — Discharge Instructions (Signed)

## 2014-01-18 NOTE — Care Management Note (Signed)
    Page 1 of 1   01/18/2014     2:55:13 PM CARE MANAGEMENT NOTE 01/18/2014  Patient:  JEROME, OTTER   Account Number:  [id number]  Date Initiated:  01/18/2014  Documentation initiated by:  Theophilus Kinds  Subjective/Objective Assessment:   Pt admitted from home with vaginal bleeding and anemia. Pt lives with her boyfriend and grandchild. Pt is indepdent with ADL's.     Action/Plan:   Pt given list of community resources. Pt did not want CM to arrange PCP at this time. Pt discharged home today. No [facility] voucher needed.   Anticipated DC Date:  01/18/2014   Anticipated DC Plan:  [place]  CM consult      Choice offered to / List presented to:             Status of service:  Completed, signed off Medicare Important Message given?   (If response is "NO", the following Medicare IM given date fields will be blank) Date Medicare IM given:   Medicare IM given by:   Date Additional Medicare IM given:   Additional Medicare IM given by:    Discharge Disposition:  HOME/SELF CARE  Per UR Regulation:    If discussed at Long Length of Stay Meetings, dates discussed:    Comments:  01/18/14 [place], RN BSN CM

## 2014-01-18 NOTE — Progress Notes (Signed)
INITIAL NUTRITION ASSESSMENT  DOCUMENTATION CODES Per approved criteria  -Severe malnutrition in the context of acute illness or injury   Pt meets criteria for severe MALNUTRITION in the context of acute illness as evidenced by moderate fat and muscle depletion.  INTERVENTION: Resource Breeze po TID, each supplement provides 250 kcal and 9 grams of protein  NUTRITION DIAGNOSIS: Inadequate oral intake related to early satiety as evidenced by PO: 25%, moderate fat and muscle depletion.   Goal: Pt will meet >90% of estimated nutritional needs  Monitor:  PO/supplement intake, labs, weight changes, I/O's   Reason for Assessment: MST=3  48 y.o. female  Admitting Dx: <principal problem not specified>  ASSESSMENT: Pt admitted with c/o vaginal bleeding, generalized weakness, lack of appetite and hypotension.  Pt reports gradual weight loss over the past year. She reports her UBW was around 135#, last weighing in this range approximately 1 year ago. She reports she initially thought her weight loss was due to the physical nature of her job, however, continued to lose weight after being laid off 2-3 months ago. Also reports poor appetite for approximately 2-3 months. Denies chewing or swallowing problems, but complains of early satiety. She estimates she ate 25% of breakfast this AM (bacon, coffee, and orange juice). She tried Ensure products at home, but does not like the taste. Agreeable to [employer].  Pt usually eats one meals per day in the evening, which consists of pork chops, pinto beans, and cabbage. She snacks 1-2 times per day, which include chicken noodle soup, peanut butter crackers, and yogurt. Beverages consist mainly of water, but she will occasionally drink Sprite. Educated pt on small, frequent meals to maximize PO intake. Also discussed examples of high protein snacks, including yogurt, string cheese and fruit, and peanut butter crackers. Pt very grateful for RD visit.   Labs WNL.   Nutrition Focused Physical Exam:  Subcutaneous Fat:  Orbital Region: WDL Upper Arm Region: moderate depletion Thoracic and Lumbar Region: WDL  Muscle:  Temple Region: WDL Clavicle Bone Region: mild depletion Clavicle and Acromion Bone Region: WDL Scapular Bone Region: WDL Dorsal Hand: mild depletion Patellar Region: moderate depletion Anterior Thigh Region: moderate depletion Posterior Calf Region: moderate depletion  Edema: none present  Height: Ht Readings from Last 1 Encounters:  01/17/14 5\' 5"  (1.651 m)    Weight: Wt Readings from Last 1 Encounters:  01/17/14 116 lb 13.5 oz (53 kg)    Ideal Body Weight: 125#  % Ideal Body Weight: 93%  Wt Readings from Last 10 Encounters:  01/17/14 116 lb 13.5 oz (53 kg)  08/05/09 145 lb (65.772 kg)    Usual Body Weight: 135#  % Usual Body Weight: 85%  BMI:  Body mass index is 19.44 kg/(m^2). Normal weight range  Estimated Nutritional Needs: Kcal: 1325-1590 Protein: 58-69 grams Fluid: 1.3-1.6 L  Skin: Intact, jaundice  Diet Order: General  EDUCATION NEEDS: -Education needs addressed   Intake/Output Summary (Last 24 hours) at 01/18/14 0930 Last data filed at 01/17/14 2230  Gross per 24 hour  Intake    150 ml  Output      0 ml  Net    150 ml    Last BM: 01/17/14  Labs:   Recent Labs Lab 01/17/14 1350 01/18/14 0647  NA 141 140  K 4.4 3.8  CL 107 102  CO2 24 26  BUN 7 7  CREATININE 0.59 0.55  CALCIUM 8.1* 8.8  GLUCOSE 125* 93    CBG (last 3)  No results found for this basename: GLUCAP,  in the last 72 hours  Scheduled Meds: . megestrol  120 mg Oral Daily    Continuous Infusions:   Past Medical History  Diagnosis Date  . Breast mass, right     Benign fibroid adenoma  . 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 Coronary atherosclerosis of native coronary artery     90% LAD ,100%RCA, LVEF 50-55%  . Hyperlipidemia   . Myocardial infarction     NSTEMI 5/06    Past Surgical  History  Procedure Laterality Date  . Breast lumpectomy    . Coronary artery bypass graft  2006    Off pump LIMA to LAD, SVG to PDA    Kaylinn Dedic A. Jimmye Norman, RD, LDN Pager: [phone]

## 2014-01-21 ENCOUNTER — Telehealth: Payer: Self-pay | Admitting: Obstetrics & Gynecology

## 2014-01-21 NOTE — Telephone Encounter (Signed)
Pt states follow up with Dr. Elonda Husky in regards to Oncologist referral.

## 2014-01-24 DIAGNOSIS — N390 Urinary tract infection, site not specified: Secondary | ICD-10-CM | POA: Diagnosis not present

## 2014-01-24 DIAGNOSIS — Z951 Presence of aortocoronary bypass graft: Secondary | ICD-10-CM | POA: Diagnosis not present

## 2014-01-24 DIAGNOSIS — F17200 Nicotine dependence, unspecified, uncomplicated: Secondary | ICD-10-CM | POA: Diagnosis not present

## 2014-01-24 DIAGNOSIS — Z8639 Personal history of other endocrine, nutritional and metabolic disease: Secondary | ICD-10-CM | POA: Insufficient documentation

## 2014-01-24 DIAGNOSIS — Z7982 Long term (current) use of aspirin: Secondary | ICD-10-CM | POA: Insufficient documentation

## 2014-01-24 DIAGNOSIS — Z8742 Personal history of other diseases of the female genital tract: Secondary | ICD-10-CM | POA: Insufficient documentation

## 2014-01-24 DIAGNOSIS — Z862 Personal history of diseases of the blood and blood-forming organs and certain disorders involving the immune mechanism: Secondary | ICD-10-CM | POA: Diagnosis not present

## 2014-01-24 DIAGNOSIS — I2510 Atherosclerotic heart disease of native coronary artery without angina pectoris: Secondary | ICD-10-CM | POA: Diagnosis not present

## 2014-01-24 DIAGNOSIS — N949 Unspecified condition associated with female genital organs and menstrual cycle: Secondary | ICD-10-CM | POA: Diagnosis not present

## 2014-01-24 DIAGNOSIS — I252 Old myocardial infarction: Secondary | ICD-10-CM | POA: Insufficient documentation

## 2014-01-25 ENCOUNTER — Emergency Department (HOSPITAL_COMMUNITY)
Admission: EM | Admit: 2014-01-25 | Discharge: 2014-01-25 | Disposition: A | Discharge status: Home / Self Care | Payer: 59 | Attending: Emergency Medicine | Admitting: Emergency Medicine

## 2014-01-25 ENCOUNTER — Telehealth: Payer: Self-pay | Admitting: *Deleted

## 2014-01-25 ENCOUNTER — Encounter (HOSPITAL_COMMUNITY): Payer: Self-pay | Admitting: Emergency Medicine

## 2014-01-25 DIAGNOSIS — N390 Urinary tract infection, site not specified: Secondary | ICD-10-CM

## 2014-01-25 DIAGNOSIS — R102 Pelvic and perineal pain: Secondary | ICD-10-CM

## 2014-01-25 LAB — URINALYSIS, ROUTINE W REFLEX MICROSCOPIC
Bilirubin Urine: NEGATIVE
Glucose, UA: NEGATIVE mg/dL
Ketones, ur: NEGATIVE mg/dL
Nitrite: NEGATIVE
Protein, ur: 30 mg/dL — AB
SPECIFIC GRAVITY, URINE: 1.01 (ref 1.005–1.030)
UROBILINOGEN UA: 0.2 mg/dL (ref 0.0–1.0)
pH: 7.5 (ref 5.0–8.0)

## 2014-01-25 LAB — URINE MICROSCOPIC-ADD ON

## 2014-01-25 MED ORDER — OXYCODONE-ACETAMINOPHEN 5-325 MG PO TABS
2.0000 | ORAL_TABLET | Freq: Once | ORAL | Status: AC
  Administered 2014-01-25: 2 via ORAL
  Filled 2014-01-25: qty 2

## 2014-01-25 MED ORDER — CEPHALEXIN 500 MG PO CAPS: 500.0000 mg | ORAL_CAPSULE | Freq: Four times a day (QID) | ORAL | Status: DC

## 2014-01-25 MED ORDER — OXYCODONE-ACETAMINOPHEN 5-325 MG PO TABS: 1.0000 | ORAL_TABLET | ORAL | Status: DC | PRN

## 2014-01-25 MED ORDER — CEPHALEXIN 500 MG PO CAPS
500.0000 mg | ORAL_CAPSULE | Freq: Once | ORAL | Status: AC
  Administered 2014-01-25: 500 mg via ORAL
  Filled 2014-01-25: qty 1

## 2014-01-25 NOTE — ED Provider Notes (Signed)
CSN: [id number]     Arrival date & time 01/24/14  2353 History  This chart was scribed for Denise Cable, MD by [name], ED Scribe. The patient was seen in [facility]03/[facility]. The patient's care was started at 12:04 AM.   Chief Complaint  Patient presents with  . Pelvic Pain    Patient is a 48 y.o. female presenting with pelvic pain. The history is provided by the patient. No language interpreter was used.  Pelvic Pain This is a recurrent problem. The current episode started yesterday. The problem occurs constantly. The problem has not changed since onset.Nothing aggravates the symptoms. Nothing relieves the symptoms. She has tried nothing for the symptoms.   HPI Comments: [name] is a 48 y.o. female with a history of cervical cancer who presents to the Emergency Department complaining of constant, severe pelvic pain beginning yesterday. She has experienced similar pain in the past. Patient reports associated chills. She denies vaginal bleeding, fever, vomiting, dysuria, increased urinary frequency.   Past Medical History  Diagnosis Date  . Breast mass, right     Benign fibroid adenoma  . 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 Coronary atherosclerosis of native coronary artery     90% LAD ,100%RCA, LVEF 50-55%  . Hyperlipidemia   . Myocardial infarction     NSTEMI 5/06   Past Surgical History  Procedure Laterality Date  . Breast lumpectomy    . Coronary artery bypass graft  2006    Off pump LIMA to LAD, SVG to PDA   No family history on file. History  Substance Use Topics  . Smoking status: Current Every Day Smoker    Types: Cigarettes  . Smokeless tobacco: Never Used  . Alcohol Use: No   OB History   Grav Para Term Preterm Abortions TAB SAB Ect Mult Living                 Review of Systems  Constitutional: Positive for chills. Negative for fever.  Gastrointestinal: Negative for vomiting.  Genitourinary: Positive for pelvic pain. Negative for dysuria,  frequency and vaginal bleeding.  All other systems reviewed and are negative.     Allergies  Review of patient's allergies indicates no known allergies.  Home Medications   Prior to Admission medications   Medication Sig Start Date End Date Taking? Authorizing Provider  aspirin 81 MG tablet Take 81 mg by mouth daily.      Historical Provider, MD  Aspirin-Acetaminophen-Caffeine (GOODY HEADACHE PO) Take 1-2 Packages by mouth 3 (three) times daily as needed (pain).    Historical Provider, MD  oxyCODONE-acetaminophen (PERCOCET/ROXICET) 5-325 MG per tablet Take 1 tablet by mouth every 4 (four) hours as needed for severe pain. 01/18/14   Florian Buff, MD   BP 125/88  Pulse 101  Temp(Src) 99.1 F (37.3 C) (Oral)  Resp 18  Ht 5\' 5"  (1.651 m)  Wt 115 lb (52.164 kg)  BMI 19.14 kg/m2  SpO2 98%  LMP 01/13/2014 Physical Exam CONSTITUTIONAL: Well developed/well nourished HEAD: Normocephalic/atraumatic EYES: EOMI/PERRL ENMT: Mucous membranes moist NECK: supple no meningeal signs SPINE:entire spine nontender CV: S1/S2 noted, no murmurs/rubs/gallops noted LUNGS: Lungs are clear to auscultation bilaterally, no apparent distress ABDOMEN: soft, nontender, no rebound or guarding GU:no cva tenderness NEURO: Pt is awake/alert, moves all extremitiesx4 EXTREMITIES: pulses normal, full ROM SKIN: warm, color normal PSYCH: no abnormalities of mood noted  ED Course  Procedures  DIAGNOSTIC STUDIES: Oxygen Saturation is 98% on room  air, normal by my interpretation.    COORDINATION OF CARE: 12:09 AM-Discussed treatment plan which includes Percocet/Roxicet and UA with pt at bedside and pt agreed to plan.   12:39 AM Pt admits she has had this pelvic pain intermittently for awhile She had recent pelvic ultrasound.  She was recently diagnosed with cervical CA and is awaiting oncology evaluation.  She is not pregnant per recent labs.   U/a currently pending 1:01 AM UTI noted Pt improved Her  abdomen is soft without any focal tenderness She is well appearing She admits having this pain on/off for awhile, similar to prior episodes of bilateral pelvic cramping I deferred pelvic exam as she had recent pelvic ultrasound and recent biopsy. Labs Review Labs Reviewed  URINALYSIS, ROUTINE W REFLEX MICROSCOPIC - Abnormal; Notable for the following:    APPearance CLOUDY (*)    Hgb urine dipstick LARGE (*)    Protein, ur 30 (*)    Leukocytes, UA LARGE (*)    All other components within normal limits  URINE MICROSCOPIC-ADD ON - Abnormal; Notable for the following:    Bacteria, UA MANY (*)    All other components within normal limits      MDM   Final diagnoses:  Pelvic pain in female  UTI (lower urinary tract infection)    Nursing notes including past medical history and social history reviewed and considered in documentation Labs/vital reviewed and considered   I personally performed the services described in this documentation, which was scribed in my presence. The recorded information has been reviewed and is accurate.      Denise Cable, MD 01/25/14 [phone]

## 2014-01-25 NOTE — Telephone Encounter (Signed)
Pt requesting pain medication until her appt with oncologist this Friday. Pain in abdomen and pelvic pain rated 10 on 1-10 scale.

## 2014-01-25 NOTE — ED Notes (Signed)
Patient states she was seen here on Monday; states saw Dr. Elonda Husky on Tuesday and biopsies were done.  Patient states she was diagnosed with cervical cancer and she has run out of pain medication and is continuing to have pain.

## 2014-01-26 MED ORDER — HYDROMORPHONE HCL 2 MG PO TABS: 2.0000 mg | ORAL_TABLET | ORAL | Status: DC | PRN

## 2014-01-26 NOTE — Telephone Encounter (Signed)
Pt informed RX for dilauded left at front desk for pick up.

## 2014-01-26 NOTE — Telephone Encounter (Signed)
Message sent to Dr. Elonda Husky

## 2014-01-29 ENCOUNTER — Ambulatory Visit: Payer: 59 | Attending: Gynecologic Oncology | Admitting: Gynecologic Oncology

## 2014-01-29 ENCOUNTER — Encounter: Payer: Self-pay | Admitting: Gynecologic Oncology

## 2014-01-29 VITALS — BP 137/66 | HR 92 | Ht 64.72 in | Wt 114.6 lb

## 2014-01-29 DIAGNOSIS — Z79899 Other long term (current) drug therapy: Secondary | ICD-10-CM | POA: Diagnosis not present

## 2014-01-29 DIAGNOSIS — C539 Malignant neoplasm of cervix uteri, unspecified: Secondary | ICD-10-CM | POA: Diagnosis present

## 2014-01-29 DIAGNOSIS — I2510 Atherosclerotic heart disease of native coronary artery without angina pectoris: Secondary | ICD-10-CM | POA: Insufficient documentation

## 2014-01-29 DIAGNOSIS — Z7982 Long term (current) use of aspirin: Secondary | ICD-10-CM | POA: Insufficient documentation

## 2014-01-29 DIAGNOSIS — I252 Old myocardial infarction: Secondary | ICD-10-CM | POA: Insufficient documentation

## 2014-01-29 DIAGNOSIS — F17200 Nicotine dependence, unspecified, uncomplicated: Secondary | ICD-10-CM | POA: Diagnosis not present

## 2014-01-29 DIAGNOSIS — E785 Hyperlipidemia, unspecified: Secondary | ICD-10-CM | POA: Insufficient documentation

## 2014-01-29 DIAGNOSIS — Z951 Presence of aortocoronary bypass graft: Secondary | ICD-10-CM | POA: Diagnosis not present

## 2014-01-29 MED ORDER — HYDROMORPHONE HCL 2 MG PO TABS: 2.0000 mg | ORAL_TABLET | ORAL | Status: DC | PRN

## 2014-01-29 MED ORDER — OXYCODONE HCL ER 15 MG PO T12A: 15.0000 mg | EXTENDED_RELEASE_TABLET | Freq: Two times a day (BID) | ORAL | Status: DC

## 2014-01-29 MED ORDER — DOCUSATE SODIUM 100 MG PO CAPS: 100.0000 mg | ORAL_CAPSULE | Freq: Two times a day (BID) | ORAL | Status: DC

## 2014-01-29 NOTE — Progress Notes (Signed)
Consult Note: Gyn-Onc  Consult was requested by Dr. Tania Ade for the evaluation of [name] 48 y.o. female with cervical cancer  CC:  Chief Complaint  Patient presents with  . Cervical Cancer    vaginal bleeding    Assessment/Plan:  Ms. [name]  is a 48 y.o.  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  The tumor is predominantly expansive towards the right sidewall with involvement of the anterior right vaginal fornix. There is no apparent or obvious bladder involvement. However her, we will evaluate ureteral obstruction with PET/CT. This will also help identify distant metastatic disease, or lymphadenopathy.  I discussed with [name] and her family diagnosis and a clinical stage the information I have available are present. She understands that staging may change slightly based on impending imaging results. I discussed that with a locally invasive cervical malignancy standard of care therapy would include chemoradiation with external beam and brachytherapy and weekly cisplatin chemosensitization. I discussed that if this is to a stage IIB lesion, approximately 65% of patients will experience complete response to therapy and an enduring remission. I discussed that her PET/CT reveals distant metastatic disease I would still recommend radiation to the pelvis to control her heavy vaginal bleeding which is requiring hospitalization and blood transfusions, however that therapy would also require multiagent chemotherapy.   She has no active bleeding at present but is at increased risk for developing a new episode of hemorrhage. Radiation should help stabilize and prevent additional episodes of this. The patient has very limited social resources including no means for independent transportation and lives on her mother-in-law who has recurrent colon cancer for her  transportation. The patient strongly desires treatment is close to home as possible. I discussed with the patient that we will attempt to facilitate this in [place] an and [place] however is still think she benefits from consultation with Dr. Marylou Mccoy either from radiation oncology here at [facility] because it is likely she will require pretty therapy administration here if those services are not available closer to home.  HPI: [name] is a 48 year old gravida 4 para 4 who has a eight-month history of intermenstrual bleeding, postcoital spotting, progressive pelvic pain, and abnormal vaginal discharge. In late July 2015 she developed an episode of vaginal hemorrhage and was seen by her local emergency room where she was admitted for a blood transfusion for hemoglobin of 5.6. She was evaluated by Dr. Elonda Husky who identified an abnormal-appearing cervix and performed a cervical biopsy on 01/18/2014. Pathology revealed moderately differentiated invasive squamous cell carcinoma of the cervix. Patient was also treated for a urinary tract infection at that time. She was prescribed by mouth dilaudid for pelvic pain and reports that while this provides some relief daily for short lived and she requests a take approximate 8 mg at the time 2 ameliorate her symptoms.   She has a history of scant and intermittent medical care. Her last Pap smear was between 10 and 20 years ago. She is unclear at if she has ever had abnormal Pap smears prior to that time. She experienced a myocardial infarction in 2006 and was treated with a coronary artery bypass graft 2 vessels. She's not seen her cardiologist since approximate one year after the surgery, however reports that she feels well from that standpoint. She continues to be a heavy smoker and smokes 1-2 packs a day.  Interval History: She continues to have only light vaginal spotting. She continues to have pelvic pain.  Current Meds:  Outpatient Encounter Prescriptions  as of 01/29/2014  Medication Sig  . aspirin 81 MG tablet Take 81 mg by mouth daily.    Marland Kitchen docusate sodium (COLACE) 100 MG capsule Take 1 capsule (100 mg total) by mouth 2 (two) times daily.  Marland Kitchen HYDROmorphone (DILAUDID) 2 MG tablet Take 1 tablet (2 mg total) by mouth every 4 (four) hours as needed for severe pain.  . OxyCODONE (OXYCONTIN) 15 mg T12A 12 hr tablet Take 1 tablet (15 mg total) by mouth every 12 (twelve) hours.  Marland Kitchen oxyCODONE-acetaminophen (PERCOCET/ROXICET) 5-325 MG per tablet Take 1 tablet by mouth every 4 (four) hours as needed for severe pain.  . [DISCONTINUED] cephALEXin (KEFLEX) 500 MG capsule Take 1 capsule (500 mg total) by mouth 4 (four) times daily.  . [DISCONTINUED] HYDROmorphone (DILAUDID) 2 MG tablet Take 1 tablet (2 mg total) by mouth every 4 (four) hours as needed for severe pain.    Allergy: No Known Allergies  Social Hx:   History   Social History  . Marital Status: Single    Spouse Name: N/A    Number of Children: 4  . Years of Education: N/A   Occupational History  . Not on file.   Social History Main Topics  . Smoking status: Current Every Day Smoker    Types: Cigarettes  . Smokeless tobacco: Never Used  . Alcohol Use: No  . Drug Use: No  . Sexual Activity: Not on file   Other Topics Concern  . Not on file   Social History Narrative  . No narrative on file    Past Surgical Hx:  Past Surgical History  Procedure Laterality Date  . Breast lumpectomy    . Coronary artery bypass graft  2006    Off pump LIMA to LAD, SVG to PDA  . Tubal ligation      Past Medical Hx:  Past Medical History  Diagnosis Date  . Breast mass, right     Benign fibroid adenoma  . 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 Coronary atherosclerosis of native coronary artery     90% LAD ,100%RCA, LVEF 50-55%  . Hyperlipidemia   . Myocardial infarction     NSTEMI 5/06    Past Gynecological History:  SVD x 4, scant gynecologic care (last pap 10-20 years ago). Hx of tubal  ligation.  Patient's last menstrual period was 01/13/2014.  Family Hx: History reviewed. No pertinent family history.  Review of Systems:  Constitutional  Feels fatigue which has been progressive, + weightloss  ENT Normal appearing ears and nares bilaterally Skin/Breast  No rash, sores, jaundice, itching, dryness Cardiovascular  No chest pain. + SOB on exertion. Can walk up a flight of stairs. Can lay flat. Pulmonary  No cough or wheeze.  Gastro Intestinal  No nausea, vomitting, or diarrhoea. No bright red blood per rectum, no abdominal pain, change in bowel movement, or constipation.  Genito Urinary  No frequency, urgency, dysuria, see HPI. Musculo Skeletal  No myalgia, arthralgia, joint swelling or pain  Neurologic  No weakness, numbness, change in gait,  Psychology  No depression, anxiety, insomnia.   Vitals:  Blood pressure 137/66, pulse 92, height 5' 4.72" (1.644 m), weight 114 lb 9.6 oz (51.982 kg), last menstrual period 01/13/2014.  Physical Exam: WD in NAD Neck  Supple NROM, without any enlargements.  Lymph Node Survey No cervical supraclavicular  or inguinal adenopathy Cardiovascular  Pulse normal rate, regularity and rhythm. S1 and S2 normal.  Lungs  Clear to auscultation bilateraly, without wheezes/crackles/rhonchi. Good air movement.  Skin  No rash/lesions/breakdown  Psychiatry  Alert and oriented to person, place, and time  Abdomen  Normoactive bowel sounds, abdomen soft, non-tender and thin without evidence of hernia. Back No CVA tenderness Genito Urinary  Vulva/vagina: Normal external female genitalia.  No lesions. + discharge and bleeding emanating from vagina.  Bladder/urethra:  No lesions or masses, well supported bladder  Vagina: Tumor involving right anterior vaginal fornix (proximal 2cm) extending from right side of cervix.  Cervix: Grossly enlarged (8cm), necrotic, friable, replaced by tumor (Rt >Lt). Rt parametrium palpably involved by tumor  extending laterally but not to side wall.  Uterus: Small, mobile, no parametrial involvement or nodularity.  Adnexa: No apparent masses. Rectal  Good tone, no masses no cul de sac nodularity. Uterosacral thickening and tumor involvement on the right. Extremities  No bilateral cyanosis, clubbing or edema.  Donaciano Eva, MD  01/29/2014, 3:46 PM

## 2014-02-03 ENCOUNTER — Encounter (HOSPITAL_COMMUNITY): Payer: Self-pay | Admitting: Emergency Medicine

## 2014-02-03 ENCOUNTER — Observation Stay (HOSPITAL_COMMUNITY)
Admission: EM | Admit: 2014-02-03 | Discharge: 2014-02-04 | Disposition: A | Discharge status: Home / Self Care | Payer: 59 | Attending: Obstetrics and Gynecology | Admitting: Obstetrics and Gynecology

## 2014-02-03 ENCOUNTER — Encounter (HOSPITAL_COMMUNITY)
Admission: RE | Admit: 2014-02-03 | Discharge: 2014-02-03 | Disposition: A | Discharge status: Home / Self Care | Payer: 59 | Source: Ambulatory Visit | Attending: Diagnostic Radiology | Admitting: Diagnostic Radiology

## 2014-02-03 ENCOUNTER — Encounter (HOSPITAL_COMMUNITY): Payer: Self-pay

## 2014-02-03 DIAGNOSIS — I252 Old myocardial infarction: Secondary | ICD-10-CM | POA: Insufficient documentation

## 2014-02-03 DIAGNOSIS — C539 Malignant neoplasm of cervix uteri, unspecified: Secondary | ICD-10-CM | POA: Insufficient documentation

## 2014-02-03 DIAGNOSIS — N1330 Unspecified hydronephrosis: Secondary | ICD-10-CM | POA: Diagnosis not present

## 2014-02-03 DIAGNOSIS — Z7982 Long term (current) use of aspirin: Secondary | ICD-10-CM | POA: Diagnosis not present

## 2014-02-03 DIAGNOSIS — R000 Tachycardia, unspecified: Secondary | ICD-10-CM | POA: Insufficient documentation

## 2014-02-03 DIAGNOSIS — Z8639 Personal history of other endocrine, nutritional and metabolic disease: Secondary | ICD-10-CM | POA: Diagnosis not present

## 2014-02-03 DIAGNOSIS — N898 Other specified noninflammatory disorders of vagina: Principal | ICD-10-CM | POA: Insufficient documentation

## 2014-02-03 DIAGNOSIS — Z862 Personal history of diseases of the blood and blood-forming organs and certain disorders involving the immune mechanism: Secondary | ICD-10-CM | POA: Insufficient documentation

## 2014-02-03 DIAGNOSIS — N949 Unspecified condition associated with female genital organs and menstrual cycle: Secondary | ICD-10-CM | POA: Diagnosis not present

## 2014-02-03 DIAGNOSIS — N939 Abnormal uterine and vaginal bleeding, unspecified: Secondary | ICD-10-CM

## 2014-02-03 DIAGNOSIS — N135 Crossing vessel and stricture of ureter without hydronephrosis: Secondary | ICD-10-CM | POA: Insufficient documentation

## 2014-02-03 DIAGNOSIS — F17200 Nicotine dependence, unspecified, uncomplicated: Secondary | ICD-10-CM | POA: Insufficient documentation

## 2014-02-03 DIAGNOSIS — I2510 Atherosclerotic heart disease of native coronary artery without angina pectoris: Secondary | ICD-10-CM | POA: Diagnosis not present

## 2014-02-03 DIAGNOSIS — Z951 Presence of aortocoronary bypass graft: Secondary | ICD-10-CM | POA: Insufficient documentation

## 2014-02-03 DIAGNOSIS — Z79899 Other long term (current) drug therapy: Secondary | ICD-10-CM | POA: Insufficient documentation

## 2014-02-03 LAB — BASIC METABOLIC PANEL
Anion gap: 16 — ABNORMAL HIGH (ref 5–15)
BUN: 5 mg/dL — ABNORMAL LOW (ref 6–23)
CALCIUM: 9.1 mg/dL (ref 8.4–10.5)
CO2: 24 mEq/L (ref 19–32)
Chloride: 97 mEq/L (ref 96–112)
Creatinine, Ser: 0.48 mg/dL — ABNORMAL LOW (ref 0.50–1.10)
GFR calc Af Amer: >90 mL/min (ref >90)
GLUCOSE: 95 mg/dL (ref 70–99)
Potassium: 3.2 mEq/L — ABNORMAL LOW (ref 3.7–5.3)
Sodium: 137 mEq/L (ref 137–147)

## 2014-02-03 LAB — CBC WITH DIFFERENTIAL/PLATELET
BASOS ABS: 0 10*3/uL (ref 0.0–0.1)
BASOS PCT: 0 % (ref 0–1)
EOS ABS: 0.8 10*3/uL — AB (ref 0.0–0.7)
Eosinophils Relative: 9 % — ABNORMAL HIGH (ref 0–5)
HEMATOCRIT: 30.2 % — AB (ref 36.0–46.0)
Hemoglobin: 9.8 g/dL — ABNORMAL LOW (ref 12.0–15.0)
Lymphocytes Relative: 13 % (ref 12–46)
Lymphs Abs: 1.2 10*3/uL (ref 0.7–4.0)
MCH: 31.2 pg (ref 26.0–34.0)
MCHC: 32.5 g/dL (ref 30.0–36.0)
MCV: 96.2 fL (ref 78.0–100.0)
MONO ABS: 0.5 10*3/uL (ref 0.1–1.0)
Monocytes Relative: 6 % (ref 3–12)
Neutro Abs: 6.9 10*3/uL (ref 1.7–7.7)
Neutrophils Relative %: 72 % (ref 43–77)
Platelets: 347 10*3/uL (ref 150–400)
RBC: 3.14 MIL/uL — ABNORMAL LOW (ref 3.87–5.11)
RDW: 17.1 % — ABNORMAL HIGH (ref 11.5–15.5)
WBC: 9.4 10*3/uL (ref 4.0–10.5)

## 2014-02-03 LAB — SAMPLE TO BLOOD BANK

## 2014-02-03 LAB — GLUCOSE, CAPILLARY: Glucose-Capillary: 122 mg/dL — ABNORMAL HIGH (ref 70–99)

## 2014-02-03 MED ORDER — SODIUM CHLORIDE 0.9 % IV SOLN: 250.0000 mL | INTRAVENOUS | Status: DC | PRN

## 2014-02-03 MED ORDER — SODIUM CHLORIDE 0.9 % IJ SOLN: 3.0000 mL | INTRAMUSCULAR | Status: DC | PRN

## 2014-02-03 MED ORDER — PRENATAL MULTIVITAMIN CH
1.0000 | ORAL_TABLET | Freq: Every day | ORAL | Status: DC
  Administered 2014-02-04: 1 via ORAL
  Filled 2014-02-03 (×2): qty 1

## 2014-02-03 MED ORDER — ENOXAPARIN SODIUM 40 MG/0.4ML ~~LOC~~ SOLN
40.0000 mg | SUBCUTANEOUS | Status: DC
  Administered 2014-02-04: 40 mg via SUBCUTANEOUS
  Filled 2014-02-03 (×2): qty 0.4

## 2014-02-03 MED ORDER — SODIUM CHLORIDE 0.9 % IJ SOLN
3.0000 mL | Freq: Two times a day (BID) | INTRAMUSCULAR | Status: DC
  Administered 2014-02-04: 3 mL via INTRAVENOUS

## 2014-02-03 MED ORDER — ONDANSETRON HCL 4 MG/2ML IJ SOLN: 4.0000 mg | Freq: Four times a day (QID) | INTRAMUSCULAR | Status: DC | PRN

## 2014-02-03 MED ORDER — DOCUSATE SODIUM 100 MG PO CAPS
100.0000 mg | ORAL_CAPSULE | Freq: Two times a day (BID) | ORAL | Status: DC
  Administered 2014-02-03 – 2014-02-04 (×2): 100 mg via ORAL
  Filled 2014-02-03 (×2): qty 1

## 2014-02-03 MED ORDER — SODIUM CHLORIDE 0.9 % IV SOLN
510.0000 mg | Freq: Once | INTRAVENOUS | Status: DC
  Filled 2014-02-03: qty 17

## 2014-02-03 MED ORDER — OXYCODONE HCL ER 15 MG PO T12A
15.0000 mg | EXTENDED_RELEASE_TABLET | Freq: Two times a day (BID) | ORAL | Status: DC
  Administered 2014-02-03 – 2014-02-04 (×2): 15 mg via ORAL
  Filled 2014-02-03 (×2): qty 1

## 2014-02-03 MED ORDER — ONDANSETRON HCL 4 MG PO TABS
4.0000 mg | ORAL_TABLET | Freq: Four times a day (QID) | ORAL | Status: DC | PRN
  Administered 2014-02-04: 4 mg via ORAL
  Filled 2014-02-03: qty 1

## 2014-02-03 MED ORDER — HYDROMORPHONE HCL PF 1 MG/ML IJ SOLN
1.0000 mg | Freq: Once | INTRAMUSCULAR | Status: AC
  Administered 2014-02-03: 1 mg via INTRAVENOUS
  Filled 2014-02-03: qty 1

## 2014-02-03 MED ORDER — FLUDEOXYGLUCOSE F - 18 (FDG) INJECTION: 5.6300 | Freq: Once | INTRAVENOUS | Status: DC | PRN

## 2014-02-03 MED ORDER — HYDROMORPHONE HCL 2 MG PO TABS
2.0000 mg | ORAL_TABLET | ORAL | Status: DC | PRN
  Administered 2014-02-04: 4 mg via ORAL
  Administered 2014-02-04: 2 mg via ORAL
  Administered 2014-02-04: 4 mg via ORAL
  Filled 2014-02-03: qty 1
  Filled 2014-02-03 (×2): qty 2

## 2014-02-03 NOTE — ED Notes (Signed)
I was diagnosed with cervical cancer and went to have my ct scan done in [place], came home this afternoon and I have been passing clots per pt. I feel like I am bleeding more than I should and my pain medication is not working per pt. I have been seeing the doctors and am now awaiting treatment per pt.

## 2014-02-03 NOTE — ED Provider Notes (Signed)
CSN: [id number]     Arrival date & time 02/03/14  2005 History   First MD Initiated Contact with Patient 02/03/14 2023     Chief Complaint  Patient presents with  . Vaginal Bleeding     (Consider location/radiation/quality/duration/timing/severity/associated sxs/prior Treatment) Patient is a 48 y.o. female presenting with vaginal bleeding.  Vaginal Bleeding Associated symptoms: no abdominal pain, no back pain and no nausea    patient with known cervical cancer. She's had to be transfused with it and has had chronic pain because of it. She's on chronic opioid therapy at home. She had a PET scan done today in [place]. She states after that she began to have vaginal bleeding. She states she has passed clots. She states she also has pelvic pain that is uncontrolled. No lightheadedness or dizziness. No fever. No chest pain.  Past Medical History  Diagnosis Date  . Breast mass, right     Benign fibroid adenoma  . 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 Coronary atherosclerosis of native coronary artery     90% LAD ,100%RCA, LVEF 50-55%  . Hyperlipidemia   . Myocardial infarction     NSTEMI 5/06   Past Surgical History  Procedure Laterality Date  . Breast lumpectomy    . Coronary artery bypass graft  2006    Off pump LIMA to LAD, SVG to PDA  . Tubal ligation     History reviewed. No pertinent family history. History  Substance Use Topics  . Smoking status: Current Every Day Smoker    Types: Cigarettes  . Smokeless tobacco: Never Used  . Alcohol Use: No   OB History   Grav Para Term Preterm Abortions TAB SAB Ect Mult Living                 Review of Systems  Constitutional: Negative for activity change and appetite change.  Eyes: Negative for pain.  Respiratory: Negative for chest tightness and shortness of breath.   Cardiovascular: Negative for chest pain and leg swelling.  Gastrointestinal: Negative for nausea, vomiting, abdominal pain and diarrhea.  Genitourinary:  Positive for vaginal bleeding and pelvic pain. Negative for flank pain.  Musculoskeletal: Negative for back pain and neck stiffness.  Skin: Negative for rash.  Neurological: Negative for weakness, numbness and headaches.  Psychiatric/Behavioral: Negative for behavioral problems.      Allergies  Review of patient's allergies indicates no known allergies.  Home Medications   Prior to Admission medications   Medication Sig Start Date End Date Taking? Authorizing Provider  aspirin 81 MG tablet Take 81 mg by mouth daily.      Historical Provider, MD  docusate sodium (COLACE) 100 MG capsule Take 1 capsule (100 mg total) by mouth 2 (two) times daily. 01/29/14   Everitt Amber, MD  HYDROmorphone (DILAUDID) 2 MG tablet Take 1 tablet (2 mg total) by mouth every 4 (four) hours as needed for severe pain. 01/29/14   Everitt Amber, MD  OxyCODONE (OXYCONTIN) 15 mg T12A 12 hr tablet Take 1 tablet (15 mg total) by mouth every 12 (twelve) hours. 01/29/14   Everitt Amber, MD  oxyCODONE-acetaminophen (PERCOCET/ROXICET) 5-325 MG per tablet Take 1 tablet by mouth every 4 (four) hours as needed for severe pain. 01/25/14   [name], MD   BP 141/65  Pulse 109  Temp(Src) 98.4 F (36.9 C) (Oral)  Resp 20  Ht 5\' 4"  (1.626 m)  Wt 114 lb (51.71 kg)  BMI 19.56 kg/m2  SpO2 100%  LMP 02/03/2014 Physical Exam  Constitutional: She is oriented to person, place, and time. She appears well-developed and well-nourished.  HENT:  Head: Normocephalic.  Eyes: Pupils are equal, round, and reactive to light.  Cardiovascular: Regular rhythm.   Mild tachycardia  Pulmonary/Chest: Effort normal and breath sounds normal.  Abdominal: Soft.  Mild lower abdominal tenderness without rebound or guarding.  Musculoskeletal: Normal range of motion.  Neurological: She is alert and oriented to person, place, and time.  Skin: Skin is warm and dry. No pallor.   pelvic exam: Mass to right side of cervix with active bleeding. Blood pools and  speculum. Clots removed.  ED Course  Procedures (including critical care time) Labs Review Labs Reviewed  CBC WITH DIFFERENTIAL  BASIC METABOLIC PANEL  SAMPLE TO BLOOD BANK    Imaging Review Nm Pet Image Initial (pi) Skull Base To Thigh  02/03/2014   CLINICAL DATA:  Initial treatment strategy for cervical squamous cell carcinoma.  EXAM: NUCLEAR MEDICINE PET SKULL BASE TO THIGH  TECHNIQUE: 5.6 mCi F-18 FDG was injected intravenously. Full-ring PET imaging was performed from the skull base to thigh after the radiotracer. CT data was obtained and used for attenuation correction and anatomic localization.  FASTING BLOOD GLUCOSE:  Value: 122 mg/dl  COMPARISON:  None.  FINDINGS: NECK  No hypermetabolic lymph nodes in the neck.  CHEST  No hypermetabolic mediastinal or hilar nodes. No suspicious pulmonary nodules on the CT scan.  ABDOMEN/PELVIS  No abnormal hypermetabolic activity within the liver, pancreas, adrenal glands, or spleen. No hypermetabolic lymph nodes in the abdomen or pelvis.  A large hypermetabolic mass measuring approximately 7 x 8 cm is seen which is centered in the cervix. This has SUV max of 31, consistent with cervical carcinoma. This mass causes obstruction of the distal right ureter causing mild to moderate right hydroureteronephrosis. There is no evidence of left-sided hydroureteronephrosis.  No hypermetabolic lymph nodes are identified within the pelvis or abdomen.  SKELETON  No focal hypermetabolic activity to suggest skeletal metastasis.  IMPRESSION: 8 cm hypermetabolic cervical mass, consistent with cervical carcinoma. The shows right-sided parametrial invasion with obstruction of the right ureter causing mild to moderate right hydroureteronephrosis. (FIGO stage IIIB).  No evidence of hypermetabolic lymph nodes or distant metastatic disease.   Electronically Signed   By: Earle Gell M.D.   On: 02/03/2014 14:43     EKG Interpretation None      MDM   Final diagnoses:  None    Patient with vaginal bleeding and known cervical cancer. Mild anemia is improved from discharge for recent transfusion, however she is mildly tachycardic and has active bleeding on pelvic exam. Will admit to gynecology.   Jasper Riling. Alvino Chapel, MD 02/03/14 2213

## 2014-02-03 NOTE — H&P (Signed)
[name] is an 48 y.o. female. She has Stage IIb Cervical cancer, now being staged, and s/p increase in her vaginal bleeding since being active today, having had her PET scan in [place] today. Examination by Dr Alvino Chapel showed some clots. Since then , while inactive , bleeding is diminished.  Pertinent Gynecological History: Menses: irreg bleeding x 6 mos due to cervical cancer , in retrospect. Bleeding: intermenstrual bleeding and AUB x 6 mo Contraception: tubal ligation DES exposure: unknown Blood transfusions: 2 units Sexually transmitted diseases:  Previous GYN Procedures:   Last mammogram:  Date:  Last pap:10-20 yrs  Date:  OB History: G, P   Menstrual History: Menarche age:  Patient's last menstrual period was 02/03/2014.    Past Medical History  Diagnosis Date  . Breast mass, right     Benign fibroid adenoma  . 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 Coronary atherosclerosis of native coronary artery     90% LAD ,100%RCA, LVEF 50-55%  . Hyperlipidemia   . Myocardial infarction     NSTEMI 5/06    Past Surgical History  Procedure Laterality Date  . Breast lumpectomy    . Coronary artery bypass graft  2006    Off pump LIMA to LAD, SVG to PDA  . Tubal ligation      History reviewed. No pertinent family history.  Social History:  reports that she has been smoking Cigarettes.  She has been smoking about 0.00 packs per day. She has never used smokeless tobacco. She reports that she does not drink alcohol or use illicit drugs.  Allergies: No Known Allergies   (Not in a hospital admission)  ROS  Blood pressure 131/69, pulse 95, temperature 98.4 F (36.9 C), temperature source Oral, resp. rate 18, height 5\' 4"  (1.626 m), weight 114 lb (51.71 kg), last menstrual period 02/03/2014, SpO2 96.00%. Physical Exam  Constitutional: She is oriented to person, place, and time. She appears well-developed.  Eyes: Pupils are equal, round, and reactive to light.   Cardiovascular: Normal rate and regular rhythm.   Respiratory: Effort normal.  Chronic cigarette cough  GI: Soft. Bowel sounds are normal. She exhibits no distension and no mass. There is no tenderness. There is no rebound and no guarding.  Genitourinary:  Exam by Dr Alvino Chapel, see his note ,. No active bleeding at this time.  Neurological: She is alert and oriented to person, place, and time.  Psychiatric: She has a normal mood and affect. Thought content normal.    Results for orders placed during the hospital encounter of 02/03/14 (from the past 24 hour(s))  CBC WITH DIFFERENTIAL     Status: Abnormal   Collection Time    02/03/14  8:50 PM      Result Value Ref Range   WBC 9.4  4.0 - 10.5 K/uL   RBC 3.14 (*) 3.87 - 5.11 MIL/uL   Hemoglobin 9.8 (*) 12.0 - 15.0 g/dL   HCT 30.2 (*) 36.0 - 46.0 %   MCV 96.2  78.0 - 100.0 fL   MCH 31.2  26.0 - 34.0 pg   MCHC 32.5  30.0 - 36.0 g/dL   RDW 17.1 (*) 11.5 - 15.5 %   Platelets 347  150 - 400 K/uL   Neutrophils Relative % 72  43 - 77 %   Neutro Abs 6.9  1.7 - 7.7 K/uL   Lymphocytes Relative 13  12 - 46 %   Lymphs Abs 1.2  0.7 - 4.0 K/uL  Monocytes Relative 6  3 - 12 %   Monocytes Absolute 0.5  0.1 - 1.0 K/uL   Eosinophils Relative 9 (*) 0 - 5 %   Eosinophils Absolute 0.8 (*) 0.0 - 0.7 K/uL   Basophils Relative 0  0 - 1 %   Basophils Absolute 0.0  0.0 - 0.1 K/uL  SAMPLE TO BLOOD BANK     Status: None   Collection Time    02/03/14  8:50 PM      Result Value Ref Range   Blood Bank Specimen SAMPLE AVAILABLE FOR TESTING     Sample Expiration 25/36/6440    BASIC METABOLIC PANEL     Status: Abnormal   Collection Time    02/03/14  8:50 PM      Result Value Ref Range   Sodium 137  137 - 147 mEq/L   Potassium 3.2 (*) 3.7 - 5.3 mEq/L   Chloride 97  96 - 112 mEq/L   CO2 24  19 - 32 mEq/L   Glucose, Bld 95  70 - 99 mg/dL   BUN 5 (*) 6 - 23 mg/dL   Creatinine, Ser 0.48 (*) 0.50 - 1.10 mg/dL   Calcium 9.1  8.4 - 10.5 mg/dL   GFR calc  non Af Amer >90  >90 mL/min   GFR calc Af Amer >90  >90 mL/min   Anion gap 16 (*) 5 - 15    Nm Pet Image Initial (pi) Skull Base To Thigh  02/03/2014   CLINICAL DATA:  Initial treatment strategy for cervical squamous cell carcinoma.  EXAM: NUCLEAR MEDICINE PET SKULL BASE TO THIGH  TECHNIQUE: 5.6 mCi F-18 FDG was injected intravenously. Full-ring PET imaging was performed from the skull base to thigh after the radiotracer. CT data was obtained and used for attenuation correction and anatomic localization.  FASTING BLOOD GLUCOSE:  Value: 122 mg/dl  COMPARISON:  None.  FINDINGS: NECK  No hypermetabolic lymph nodes in the neck.  CHEST  No hypermetabolic mediastinal or hilar nodes. No suspicious pulmonary nodules on the CT scan.  ABDOMEN/PELVIS  No abnormal hypermetabolic activity within the liver, pancreas, adrenal glands, or spleen. No hypermetabolic lymph nodes in the abdomen or pelvis.  A large hypermetabolic mass measuring approximately 7 x 8 cm is seen which is centered in the cervix. This has SUV max of 31, consistent with cervical carcinoma. This mass causes obstruction of the distal right ureter causing mild to moderate right hydroureteronephrosis. There is no evidence of left-sided hydroureteronephrosis.  No hypermetabolic lymph nodes are identified within the pelvis or abdomen.  SKELETON  No focal hypermetabolic activity to suggest skeletal metastasis.  IMPRESSION: 8 cm hypermetabolic cervical mass, consistent with cervical carcinoma. The shows right-sided parametrial invasion with obstruction of the right ureter causing mild to moderate right hydroureteronephrosis. (FIGO stage IIIB).  No evidence of hypermetabolic lymph nodes or distant metastatic disease.   Electronically Signed   By: Earle Gell M.D.   On: 02/03/2014 14:43    Assessment/Plan: Vaginal bleeding from cervical cancer.  Plan admiit for pain management and pad count.  Probable Monsels application in am.   Kensington Duerst  V 02/03/2014, 10:39 PM

## 2014-02-04 ENCOUNTER — Telehealth: Payer: Self-pay | Admitting: *Deleted

## 2014-02-04 LAB — CBC
HCT: 28 % — ABNORMAL LOW (ref 36.0–46.0)
Hemoglobin: 9 g/dL — ABNORMAL LOW (ref 12.0–15.0)
MCH: 30.9 pg (ref 26.0–34.0)
MCHC: 32.1 g/dL (ref 30.0–36.0)
MCV: 96.2 fL (ref 78.0–100.0)
Platelets: 358 10*3/uL (ref 150–400)
RBC: 2.91 MIL/uL — AB (ref 3.87–5.11)
RDW: 16.9 % — ABNORMAL HIGH (ref 11.5–15.5)
WBC: 10.1 10*3/uL (ref 4.0–10.5)

## 2014-02-04 MED ORDER — SODIUM CHLORIDE 0.9 % IV SOLN
510.0000 mg | Freq: Once | INTRAVENOUS | Status: AC
  Administered 2014-02-04: 510 mg via INTRAVENOUS
  Filled 2014-02-04: qty 17

## 2014-02-04 MED ORDER — HYDROMORPHONE HCL 2 MG PO TABS: 2.0000 mg | ORAL_TABLET | ORAL | Status: DC | PRN

## 2014-02-04 NOTE — Care Management Note (Signed)
    Page 1 of 1   02/04/2014     1:37:28 PM CARE MANAGEMENT NOTE 02/04/2014  Patient:  [name], [name]   Account Number:  [id number]  Date Initiated:  02/04/2014  Documentation initiated by:  Jolene Provost  Subjective/Objective Assessment:   Patient admitted for blood clots. patient from home with self care. Patient has no HH, DME or medication needs.     Action/Plan:   Patient plans to discharge home to self care. Patient to be discharged hom today. No CM needs at this time.   Anticipated DC Date:  02/04/2014   Anticipated DC Plan:  [place]  CM consult      Choice offered to / List presented to:             Status of service:  Completed, signed off Medicare Important Message given?   (If response is "NO", the following Medicare IM given date fields will be blank) Date Medicare IM given:   Medicare IM given by:   Date Additional Medicare IM given:   Additional Medicare IM given by:    Discharge Disposition:  HOME/SELF CARE  Per UR Regulation:    If discussed at Long Length of Stay Meetings, dates discussed:    Comments:  02/04/2014 [place], RN, MSN, [facility]

## 2014-02-04 NOTE — Progress Notes (Signed)
UR Completed Maryana Graves-Bigelow, RN,BSN [phone]  

## 2014-02-04 NOTE — Progress Notes (Signed)
Pt reported to nurse of having 1 large clot when went to bathroom, size of dollar coin, per patient. Pt reports to only spotting. No pads filled at this time. Reported to day shift nurse.

## 2014-02-04 NOTE — Progress Notes (Signed)
Subjective:   Patient reports that she had only one clot thru the night, and has some pelvic pain, but was managed on the outpatient meds with pt sleeping soundly upon my arrival this a.m..    Objective: I have reviewed patient's vital signs and labs. CBC Latest Ref Rng 02/04/2014 02/03/2014 01/18/2014  WBC 4.0 - 10.5 K/uL 10.1 9.4 10.6(H)  Hemoglobin 12.0 - 15.0 g/dL 9.0(L) 9.8(L) 9.2(L)  Hematocrit 36.0 - 46.0 % 28.0(L) 30.2(L) 26.3(L)  Platelets 150 - 400 K/uL 358 347 277     General: alert, cooperative and no distress GI: soft, non-tender; bowel sounds normal; no masses,  no organomegaly Extremities: Homans sign is negative, no sign of DVT Vaginal Bleeding: none   Assessment/Plan: Cervical Cancer Clinical IIB being staged, Vaginal bleeding , stable at present Chronic anemia.  Plan:  IV Feraheme 510 prior to discharge.           Will call Gyn Onc to discuss possibility of moving up radiation Onc meeting , to begin therapy and reduce bleeding. A review of Encounters in EPIC does NOT identify a scheduled Radiation Oncology appt.          Call message left with Dr Serita Grit office  [phone]  LOS: 1 day    Kalanie Fewell V 02/04/2014, 8:21 AM

## 2014-02-04 NOTE — Telephone Encounter (Signed)
Call from Dr.Ferguson regarding pt having episode of bleeding. Pt appt with Dr. Sondra Come moved from 8/19 to Monday 8/17 at 330pm. Attempt to reach Dr. Glo Herring on, unable to reach [facility]. Attempt to leave message with MD's office [phone] unable to leave message. WIll try again later this afternoon.

## 2014-02-04 NOTE — Discharge Instructions (Signed)
Abnormal Uterine Bleeding Abnormal uterine bleeding can affect women at various stages in life, including teenagers, women in their reproductive years, pregnant women, and women who have reached menopause. Several kinds of uterine bleeding are considered abnormal, including:  Bleeding or spotting between periods.   Bleeding after sexual intercourse.   Bleeding that is heavier or more than normal.   Periods that last longer than usual.  Bleeding after menopause.  Many cases of abnormal uterine bleeding are minor and simple to treat, while others are more serious. Any type of abnormal bleeding should be evaluated by your health care provider. Treatment will depend on the cause of the bleeding. HOME CARE INSTRUCTIONS Monitor your condition for any changes. The following actions may help to alleviate any discomfort you are experiencing:  Avoid the use of tampons and douches as directed by your health care provider.  Change your pads frequently. You should get regular pelvic exams and Pap tests. Keep all follow-up appointments for diagnostic tests as directed by your health care provider.  SEEK MEDICAL CARE IF:   Your bleeding lasts more than 1 week.   You feel dizzy at times.  SEEK IMMEDIATE MEDICAL CARE IF:   You pass out.   You are changing pads every 15 to 30 minutes.   You have abdominal pain.  You have a fever.   You become sweaty or weak.   You are passing large blood clots from the vagina.   You start to feel nauseous and vomit. MAKE SURE YOU:   Understand these instructions.  Will watch your condition.  Will get help right away if you are not doing well or get worse. Document Released: 06/11/2005 Document Revised: 06/16/2013 Document Reviewed: 01/08/2013 ExitCare Patient Information 2015 [employer]. This information is not intended to replace advice given to you by your health care provider. Make sure you discuss any questions you have with your  health care provider.  

## 2014-02-04 NOTE — Progress Notes (Signed)
NURSING PROGRESS NOTE  [name] [id number] Discharge Data: 02/04/2014 1:31 PM Attending Provider: Jonnie Kind, MD PCP:No PCP Per Patient   [name] to be D/C'd Home per MD order.    All IV's will be discontinued and monitored for bleeding.  All belongings will be returned to patient for patient to take home.  AVS summary reviewed. Follow-up appoint times indicated. Patient verbalized understanding of material.  Last Documented Vital Signs:  Blood pressure 127/68, pulse 86, temperature 98.4 F (36.9 C), temperature source Oral, resp. rate 18, height 5\' 4"  (1.626 m), weight 51.256 kg (113 lb), last menstrual period 02/03/2014, SpO2 97.00%.  Cecilie Kicks D

## 2014-02-04 NOTE — Discharge Summary (Signed)
Physician Discharge Summary  Patient ID: [name] MRN: [id number] DOB/AGE: 08/26/1965 48 y.o.  Admit date: 02/03/2014 Discharge date: 02/04/2014  Admission Diagnoses:vaginal bleeding                                        Cervical Cancer IIB -IIIB                           Discharge Diagnoses: same Active Problems:   Cervical cancer, FIGO stage IIB   Discharged Condition: good  Hospital Course: Ms. Egler was admitted on the evening of 02/03/2014 for bleeding with passage of clots after being more active during the day. She had been in [place] having a PET scan which revealed the diameter of the cervical cancer and confirmed right hydroureteronephrosis. The patient came to the emergency room due to the bleeding and was admitted for overnight observation. She did not have significant bleeding overnight and was stable for discharge. Her appointment with Dr.  Sondra Come has been scheduled for 17 August at 3:30 PM. She will followup with Dr. Tressie Stalker in [place] for Radiosensitization, and hopes to receive her Radiation Tx in [place]  Consults: None  Significant Diagnostic Studies: labs:  CBC Latest Ref Rng 02/04/2014 02/03/2014 01/18/2014  WBC 4.0 - 10.5 K/uL 10.1 9.4 10.6(H)  Hemoglobin 12.0 - 15.0 g/dL 9.0(L) 9.8(L) 9.2(L)  Hematocrit 36.0 - 46.0 % 28.0(L) 30.2(L) 26.3(L)  Platelets 150 - 400 K/uL 358 347 277      Treatments: Feraheme 510 mg iv.  Discharge Exam: Blood pressure 130/91, pulse 104, temperature 97.7 F (36.5 C), temperature source Oral, resp. rate 20, height 5\' 4"  (1.626 m), weight 113 lb (51.256 kg), last menstrual period 02/03/2014, SpO2 97.00%. General appearance: alert, cooperative and no distress GI: soft, non-tender; bowel sounds normal; no masses,  no organomegaly Extremities: extremities normal, atraumatic, no cyanosis or edema and Homans sign is negative, no sign of DVT Lymph nodes: Cervical, supraclavicular, and axillary nodes normal.  Disposition: 01-Home  or Self Care  Discharge Instructions   Call MD for:  severe uncontrolled pain    Complete by:  As directed      Call MD for:    Complete by:  As directed   Further bleeding that concerns you. We can frequently treat the spot that is bleeding in our office if you call during office hours.     Diet - low sodium heart healthy    Complete by:  As directed      Increase activity slowly    Complete by:  As directed             Medication List         docusate sodium 100 MG capsule  Commonly known as:  COLACE  Take 1 capsule (100 mg total) by mouth 2 (two) times daily.     HYDROmorphone 2 MG tablet  Commonly known as:  DILAUDID  Take 1-2 tablets (2-4 mg total) by mouth every 4 (four) hours as needed for severe pain.     OxyCODONE 15 mg T12a 12 hr tablet  Commonly known as:  OXYCONTIN  Take 1 tablet (15 mg total) by mouth every 12 (twelve) hours.           Follow-up Information   Follow up with Blair Promise, MD In 4 days. (your appointment has been moved up  to Monday the 17th at 3:30 pm, please call our office to conffirm that you can keep this appointment.)    Specialty:  Radiation Oncology   Contact information:   [place] [address] [phone]       Signed: Jonnie Kind 02/04/2014, 12:59 PM

## 2014-02-05 ENCOUNTER — Encounter: Payer: Self-pay | Admitting: Radiation Oncology

## 2014-02-05 NOTE — Progress Notes (Signed)
GYN Location of Tumor / Histology: stage IIB squamous cell carcinoma of the cervix (moderately differentiated).  [name] presented, per Dr. Denman George eight-month history of intermenstrual bleeding, postcoital spotting, progressive pelvic pain, and abnormal vaginal discharge. In late July 2015 she developed an episode of vaginal hemorrhage and was seen by her local emergency room where she was admitted for a blood transfusion for hemoglobin of 5.6. She was evaluated by Dr. Elonda Husky who identified an abnormal-appearing cervix and performed a cervical biopsy on 01/18/2014."  Biopsies of cervix revealed:   01/18/14 Invasive squamous cell carcinoma  PET results from 02/03/14: IMPRESSION: 8 cm hypermetabolic cervical mass, consistent with cervical carcinoma. The shows right-sided parametrial invasion with obstruction of the right ureter causing mild to moderate right hydroureteronephrosis. (FIGO stage IIIB). No evidence of hypermetabolic lymph nodes or distant metastatic disease.   Past/Anticipated interventions by Gyn/Onc surgery, if any: 01/18/14 - endometrial biopsy  Past/Anticipated interventions by medical oncology, if any: chemoradiation with external beam and brachytherapy and weekly cisplatin chemosensitization.  Weight changes, if any: yes has lost 3 lb in last week due to poor appetite.  Bowel/Bladder complaints, if any: no  Nausea/Vomiting, if any: yes - has nausea but has not vomited.  Pain issues, if any:  Has pain 5/10 in her pelvis radiating around to her back.  Takes oxycodone 15 mg q 12 hours and dilaudid 2 mg, 2 tablets q 4 hours.  OB/GYN history: SVD x 4, scant gynecologic care (last pap 10-20 years ago). Hx of tubal ligation. Patient's last menstrual period was 01/13/2014.  SAFETY ISSUES:  Prior radiation? no  Pacemaker/ICD? no  Possible current pregnancy? no  Is the patient on methotrexate? no  Current Complaints / other details:  Patient is here with her boyfriend,  mother in law and sister in law.  She was discharged from [facility] on Thursday.  She denies having any vaginal bleeding now.

## 2014-02-08 ENCOUNTER — Ambulatory Visit
Admit: 2014-02-08 | Discharge: 2014-02-08 | Disposition: A | Discharge status: Home / Self Care | Payer: 59 | Attending: Radiation Oncology | Admitting: Radiation Oncology

## 2014-02-08 ENCOUNTER — Encounter: Payer: Self-pay | Admitting: Radiation Oncology

## 2014-02-08 VITALS — BP 140/77 | HR 93 | Temp 98.9°F | Ht 64.0 in | Wt 110.4 lb

## 2014-02-08 DIAGNOSIS — Z510 Encounter for antineoplastic radiation therapy: Secondary | ICD-10-CM | POA: Diagnosis present

## 2014-02-08 DIAGNOSIS — I252 Old myocardial infarction: Secondary | ICD-10-CM | POA: Insufficient documentation

## 2014-02-08 DIAGNOSIS — N1330 Unspecified hydronephrosis: Secondary | ICD-10-CM | POA: Diagnosis not present

## 2014-02-08 DIAGNOSIS — Z951 Presence of aortocoronary bypass graft: Secondary | ICD-10-CM | POA: Insufficient documentation

## 2014-02-08 DIAGNOSIS — F17200 Nicotine dependence, unspecified, uncomplicated: Secondary | ICD-10-CM | POA: Insufficient documentation

## 2014-02-08 DIAGNOSIS — C539 Malignant neoplasm of cervix uteri, unspecified: Secondary | ICD-10-CM | POA: Diagnosis not present

## 2014-02-08 DIAGNOSIS — N135 Crossing vessel and stricture of ureter without hydronephrosis: Secondary | ICD-10-CM | POA: Insufficient documentation

## 2014-02-08 DIAGNOSIS — I2510 Atherosclerotic heart disease of native coronary artery without angina pectoris: Secondary | ICD-10-CM | POA: Insufficient documentation

## 2014-02-08 HISTORY — DX: Malignant neoplasm of cervix uteri, unspecified: C53.9

## 2014-02-08 NOTE — Progress Notes (Signed)
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 female who is seen out courtesy of Dr. Everitt Amber for an opinion concerning radiation therapy as part management of patient's recently diagnosed advanced cervical cancer. Patient began having intermittent irregular vaginal bleeding in December of 2014. The patient is unsure whether she was in the peri-menopausal phase of her life. More recently the patient's vaginal bleeding worsened requiring presentation to the emergency room. Patient eventually underwent biopsy on 01/18/2014. A cervical biopsy revealed invasive squamous cell carcinoma. the patient was seen by Dr. Denman George and on pelvic exam the patient was noted to have an enlarged cervix measuring approximately 8 cm which was necrotic, friable and replaced by tumor. There was right parametrial involvement but no obvious sidewall involvement on clinical exam. The patient was noted to have uterosacral thickening and tumor involvement along the right side.  the patient proceeded to undergo PET scan which showed an 8 cm hypermetabolic cervical mass. There was right sided parametrial invasion was obstruction of the right ureter causing mild to moderate right hydroureteronephrosis ( FIGO stage III-B).  these was no evidence of metastatic disease on the PET scan. With this information the patient is now seen in radiation oncology for consultation.  Late last week the patient presented with severe vaginal bleeding requiring admission and this appointment has been the moved up to expedite treatment.  PREVIOUS RADIATION THERAPY: No  PAST MEDICAL HISTORY:  has a past medical history of  Breast mass, right; Dressler's syndrome; Coronary atherosclerosis of native coronary artery; Hyperlipidemia; Myocardial infarction; and Cervical cancer.    PAST SURGICAL HISTORY: Past Surgical History  Procedure Laterality Date  . Breast lumpectomy    . Coronary artery bypass graft  2006    Off pump LIMA to LAD, SVG to PDA  . Tubal ligation      FAMILY HISTORY: family history includes Diabetes in her mother.  SOCIAL HISTORY:  reports that she has been smoking Cigarettes.  She has a 35 pack-year smoking history. She has never used smokeless tobacco. She reports that she does not drink alcohol or use illicit drugs.  ALLERGIES: Review of patient's allergies indicates no known allergies.  MEDICATIONS:  Current Outpatient Prescriptions  Medication Sig Dispense Refill  . docusate sodium (COLACE) 100 MG capsule Take 1 capsule (100 mg total) by mouth 2 (two) times daily.  30 capsule  0  . HYDROmorphone (DILAUDID) 2 MG tablet Take 1-2 tablets (2-4 mg total) by mouth every 4 (four) hours as needed for severe pain.  60 tablet  0  . OxyCODONE (OXYCONTIN) 15 mg T12A 12 hr tablet Take 1 tablet (15 mg total) by mouth every 12 (twelve) hours.  60 tablet  0   No current facility-administered medications for this encounter.    REVIEW OF SYSTEMS:  A 15 point review of systems is documented in the electronic medical record. This was obtained by the nursing staff. However, I reviewed this with the patient to discuss relevant findings and make appropriate changes.  gravida 4 para 4 who has a eight-month history of intermenstrual bleeding, postcoital spotting, progressive pelvic pain, and abnormal vaginal discharge. In late July 2015 she developed an  episode of vaginal hemorrhage and was seen in the emergency room where she was admitted for a blood transfusion for hemoglobin of 5.6. She was evaluated by Dr. Elonda Husky who identified an abnormal-appearing cervix and performed a cervical biopsy on 01/18/2014. Pathology  revealed moderately differentiated invasive squamous cell carcinoma of the cervix. Poor appetite and a approximately 25 pound weight loss over the past 6 months. She complains of pain in the pelvis area from front to back but no low back pain. She denies any hematuria, urination difficulties or constipation. She wears to vaginal pads per day in light of the drainage noted.    PHYSICAL EXAM:  height is 5\' 4"  (1.626 m) and weight is 110 lb 6.4 oz (50.077 kg). Her oral temperature is 98.9 F (37.2 C). Her blood pressure is 140/77 and her pulse is 93.   BP 140/77  Pulse 93  Temp(Src) 98.9 F (37.2 C) (Oral)  Ht 5\' 4"  (1.626 m)  Wt 110 lb 6.4 oz (50.077 kg)  BMI 18.94 kg/m2  LMP 02/03/2014  General Appearance:    Alert, cooperative, no distress, appears stated age, she is accompanied by her husband, mother-in-law and daughter in law   Head:    Normocephalic, without obvious abnormality, atraumatic  Eyes:    PERRL, conjunctiva/corneas clear, EOM's intact,        Nose:   Nares normal, septum midline, mucosa normal, no drainage    or sinus tenderness  Throat:   Lips, mucosa, and tongue normal; dentures along the maxillary region, poor dentition along the mandible with most teeth missing   Neck:   Supple, symmetrical, trachea midline, no adenopathy;    thyroid:  no enlargement/tenderness/nodules; no carotid   bruit or JVD  Back:     Symmetric, no curvature, ROM normal, no CVA tenderness  Lungs:     Clear to auscultation bilaterally, respirations unlabored  Chest Wall:    No tenderness or deformity, vertical scar present from prior CABG    Heart:    Regular rate and rhythm, S1 and S2 normal, no murmur, rub   or gallop     Abdomen:     Soft, non-tender, bowel sounds active all four quadrants,    no masses, no organomegaly  Genitalia:   the cervix is replaced by an 8 cm necrotic friable tumor. There appears to be right parametrial involvement but no obvious sidewall involvement. The right anterior  fornix is obliterated by tumor. Brownish foul-smelling drainage noted but no active bleeding   Rectal:    Normal tone, right uterosacral involvement on exam. No obvious rectovaginal fistula     Extremities:   Extremities normal, atraumatic, no cyanosis or edema, some clubbing and staining of the fingernails noted consistent with significant tobacco history   Pulses:   2+ and symmetric all extremities  Skin:   Skin color, texture, turgor normal, no rashes or lesions  Lymph nodes:   Cervical, supraclavicular, and axillary nodes normal  Neurologic:    normal strength, sensation and reflexes    throughout     ECOG = 1   1 - Symptomatic but completely ambulatory (Restricted in physically strenuous activity but ambulatory and able to carry out work of a light or sedentary nature. For example, light housework, office work)    LABORATORY DATA:  Lab Results  Component Value Date   WBC 10.1 02/04/2014   HGB 9.0* 02/04/2014   HCT 28.0* 02/04/2014   MCV 96.2 02/04/2014   PLT 358 02/04/2014   NEUTROABS  6.9 02/03/2014   Lab Results  Component Value Date   NA 137 02/03/2014   K 3.2* 02/03/2014   CL 97 02/03/2014   CO2 24 02/03/2014   GLUCOSE 95 02/03/2014   CREATININE 0.48* 02/03/2014   CALCIUM 9.1 02/03/2014      RADIOGRAPHY: US Transvaginal Non-ob  01/18/2014   CLINICAL DATA:  Vaginal bleeding.  Passing clots.  EXAM: TRANSABDOMINAL AND TRANSVAGINAL ULTRASOUND OF PELVIS  TECHNIQUE: Both transabdominal and transvaginal ultrasound examinations of the pelvis were performed. Transabdominal technique was performed for global imaging of the pelvis including uterus, ovaries, adnexal regions, and pelvic cul-de-sac. It was necessary to proceed with endovaginal exam following the transabdominal exam to visualize the uterus and ovaries.  COMPARISON:  None  FINDINGS: Uterus  Measurements: 11.5x 4.2 x 5.6 cm. The cervix and lower uterine segment appear heterogeneous with increased vascularity. Clinical evaluation  is suggested to exclude cervical mass/cancer.  Endometrium  Thickness: 8.9 mm. Questionable fluid in the lower endometrial canal.  Right ovary  Measurements: 3.6 x 1.8 x 3.6 cm. Normal appearance/no adnexal mass. 1.1 cm simple cyst.  Left ovary  Measurements: 2.7 x 1.9 x 3.0 cm. Normal appearance/no adnexal mass. 9 mm simple cyst.  Other findings  No free fluid.  IMPRESSION: The cervix and lower uterine segment appeared heterogeneous with increased vascularity. No clear-cut mass lesion is noted. Clinical evaluation is suggested to exclude a cervical mass/cancer. If clinical concern remains MRI of the pelvis can be obtained.   Electronically Signed   By: Marcello Moores  Register   On: 01/18/2014 12:39   US Pelvis Complete  01/18/2014   CLINICAL DATA:  Vaginal bleeding.  Passing clots.  EXAM: TRANSABDOMINAL AND TRANSVAGINAL ULTRASOUND OF PELVIS  TECHNIQUE: Both transabdominal and transvaginal ultrasound examinations of the pelvis were performed. Transabdominal technique was performed for global imaging of the pelvis including uterus, ovaries, adnexal regions, and pelvic cul-de-sac. It was necessary to proceed with endovaginal exam following the transabdominal exam to visualize the uterus and ovaries.  COMPARISON:  None  FINDINGS: Uterus  Measurements: 11.5x 4.2 x 5.6 cm. The cervix and lower uterine segment appear heterogeneous with increased vascularity. Clinical evaluation is suggested to exclude cervical mass/cancer.  Endometrium  Thickness: 8.9 mm. Questionable fluid in the lower endometrial canal.  Right ovary  Measurements: 3.6 x 1.8 x 3.6 cm. Normal appearance/no adnexal mass. 1.1 cm simple cyst.  Left ovary  Measurements: 2.7 x 1.9 x 3.0 cm. Normal appearance/no adnexal mass. 9 mm simple cyst.  Other findings  No free fluid.  IMPRESSION: The cervix and lower uterine segment appeared heterogeneous with increased vascularity. No clear-cut mass lesion is noted. Clinical evaluation is suggested to exclude a cervical  mass/cancer. If clinical concern remains MRI of the pelvis can be obtained.   Electronically Signed   By: Marcello Moores  Register   On: 01/18/2014 12:39   Nm Pet Image Initial (pi) Skull Base To Thigh  02/03/2014   CLINICAL DATA:  Initial treatment strategy for cervical squamous cell carcinoma.  EXAM: NUCLEAR MEDICINE PET SKULL BASE TO THIGH  TECHNIQUE: 5.6 mCi F-18 FDG was injected intravenously. Full-ring PET imaging was performed from the skull base to thigh after the radiotracer. CT data was obtained and used for attenuation correction and anatomic localization.  FASTING BLOOD GLUCOSE:  Value: 122 mg/dl  COMPARISON:  None.  FINDINGS: NECK  No hypermetabolic lymph nodes in the neck.  CHEST  No hypermetabolic mediastinal or hilar nodes. No suspicious pulmonary nodules on the CT scan.  ABDOMEN/PELVIS  No abnormal hypermetabolic activity within the liver, pancreas, adrenal glands, or spleen. No hypermetabolic lymph nodes in the abdomen or pelvis.  A large hypermetabolic mass measuring approximately 7 x 8 cm is seen which is centered in the cervix. This has SUV max of 31, consistent with cervical carcinoma. This mass causes obstruction of the distal right ureter causing mild to moderate right hydroureteronephrosis. There is no evidence of left-sided hydroureteronephrosis.  No hypermetabolic lymph nodes are identified within the pelvis or abdomen.  SKELETON  No focal hypermetabolic activity to suggest skeletal metastasis.  IMPRESSION: 8 cm hypermetabolic cervical mass, consistent with cervical carcinoma. The shows right-sided parametrial invasion with obstruction of the right ureter causing mild to moderate right hydroureteronephrosis. (FIGO stage IIIB).  No evidence of hypermetabolic lymph nodes or distant metastatic disease.   Electronically Signed   By: Earle Gell M.D.   On: 02/03/2014 14:43      IMPRESSION: Stage III-B invasive squamous cell carcinoma the cervix. Patient would be a good candidate for a definitive  course of radiation along with radiosensitizing chemotherapy. The patient's radiation therapy would consist of external beam treatments and intracavitary brachytherapy treatments. I discussed with the patient and her family that she does have a more advanced stage with her hydronephrosis making chances for cure more difficult. I discussed the treatment course side effects and potential long-term toxicities of radiation therapy in this situation. Patient wishes to be aggressive with her management and wishes to proceed with combination therapy as above. The patient lives in the [place] area and will receive her external beam treatments at the [facility] in [place]. She will receive her intracavitary brachytherapy treatments in [place] and will start these approximately the fifth week of her external beam treatments. I discussed potential low dose rate cesium application but the patient is most interested in high-dose rate treatments as part of her brachytherapy management.  PLAN: Simulation August 18 at the [facility] in [place]. Patient will start her radiation therapy later this week. Anticipate between 5 1/2  and 6 weeks of external beam radiation therapy with intracavitary brachytherapy treatments as above follow. The patient will be seeing a medical oncology this week. I am unsure at this point where the patient will require placement of a ureteral stent in light of her hydronephrosis. She will likely need a urology referral to address this issue. I also discussed the importance of smoking cessation  And the patient is working on this issue.  I spent 60 minutes minutes face to face with the patient and more than 50% of that time was spent in counseling and/or coordination of care.   ------------------------------------------------  Blair Promise, PhD, MD

## 2014-02-08 NOTE — Progress Notes (Signed)
Please see the Nurse Progress Note in the MD Initial Consult Encounter for this patient. 

## 2014-02-09 ENCOUNTER — Telehealth: Payer: Self-pay | Admitting: *Deleted

## 2014-02-09 ENCOUNTER — Ambulatory Visit: Payer: 59 | Admitting: Radiation Oncology

## 2014-02-09 ENCOUNTER — Encounter: Payer: Self-pay | Admitting: *Deleted

## 2014-02-09 NOTE — Telephone Encounter (Signed)
Called [employer]  Prior authorization on Oxycotin 15mg  Q60 take 1 tablet q 12hrs for pain. Authorization # [id number].

## 2014-02-09 NOTE — Progress Notes (Signed)
[name] Psychosocial Distress Screening Clinical Social Work  Clinical Social Work was referred by distress screening protocol.  The patient scored a 10 on the Psychosocial Distress Thermometer which indicates severe distress. Clinical Social Worker contacted patient at home to assess for distress and other psychosocial needs.  Patient stated she was doing well, and her high distress level was mostly associated with her pain.  Patient will be receiving treatment in [place] before returning to [facility].  Patient stated she felt comfortable with her treatment plan, and had no concerns at this time.  CSW informed patient of the support team and support services at [facility], and encouraged patient to call with any questions or concerns.       ONCBCN DISTRESS SCREENING 02/08/2014  Screening Type Initial Screening  Elta Gu[place] the number that describes how much distress you have been experiencing in the past week 10  Physical Problem type Pain;Nausea/vomiting;Loss of appetitie  Physician notified of physical symptoms Yes   Johnnye Lana, MSW, LCSW, OSW-C Clinical Social Worker [facility] [phone]

## 2014-02-09 NOTE — Telephone Encounter (Signed)
Called [facility] gave Pharmacist Authorization # for pt rx to be filled.

## 2014-02-10 ENCOUNTER — Ambulatory Visit: Payer: 59

## 2014-02-10 ENCOUNTER — Ambulatory Visit: Payer: 59 | Admitting: Radiation Oncology

## 2014-02-11 ENCOUNTER — Encounter: Payer: Self-pay | Admitting: Obstetrics & Gynecology

## 2014-02-15 ENCOUNTER — Other Ambulatory Visit (HOSPITAL_COMMUNITY): Payer: Self-pay | Admitting: Oncology

## 2014-02-15 DIAGNOSIS — C539 Malignant neoplasm of cervix uteri, unspecified: Secondary | ICD-10-CM

## 2014-02-16 ENCOUNTER — Other Ambulatory Visit: Payer: Self-pay | Admitting: Radiology

## 2014-02-17 ENCOUNTER — Other Ambulatory Visit: Payer: Self-pay | Admitting: Radiology

## 2014-02-18 ENCOUNTER — Ambulatory Visit (HOSPITAL_COMMUNITY)
Admission: RE | Admit: 2014-02-18 | Discharge: 2014-02-18 | Disposition: A | Discharge status: Home / Self Care | Payer: 59 | Source: Ambulatory Visit | Attending: Oncology | Admitting: Oncology

## 2014-02-18 ENCOUNTER — Other Ambulatory Visit (HOSPITAL_COMMUNITY): Payer: Self-pay | Admitting: Oncology

## 2014-02-18 ENCOUNTER — Other Ambulatory Visit (HOSPITAL_COMMUNITY): Payer: 59

## 2014-02-18 DIAGNOSIS — C539 Malignant neoplasm of cervix uteri, unspecified: Secondary | ICD-10-CM | POA: Insufficient documentation

## 2014-02-18 DIAGNOSIS — Z452 Encounter for adjustment and management of vascular access device: Secondary | ICD-10-CM | POA: Insufficient documentation

## 2014-02-18 MED ORDER — LIDOCAINE HCL 1 % IJ SOLN
INTRAMUSCULAR | Status: AC
  Filled 2014-02-18: qty 20

## 2014-02-19 ENCOUNTER — Other Ambulatory Visit (HOSPITAL_COMMUNITY): Payer: 59

## 2014-03-02 ENCOUNTER — Telehealth: Payer: Self-pay | Admitting: Dietician

## 2014-03-02 NOTE — Telephone Encounter (Signed)
Brief Outpatient Oncology Nutrition Note  Patient has been identified to be at risk on malnutrition screen.  Wt Readings from Last 10 Encounters:  02/08/14 110 lb 6.4 oz (50.077 kg)  02/04/14 113 lb (51.256 kg)  01/29/14 114 lb 9.6 oz (51.982 kg)  01/25/14 115 lb (52.164 kg)  01/18/14 115 lb (52.164 kg)  01/17/14 116 lb 13.5 oz (53 kg)  08/05/09 145 lb (65.772 kg)   Dx:  Stage IIB squamous cell carcinoma of the cervix (moderately differentiated) diagnosed July 2015.  Currently receiving chemo and radiation.  Called patient due to weight loss.  Patient states that her weight is now down to 104 lbs.  States that she usually eats well on the weekend after chemo on Friday but then not as well the rest of the week.  Is drinking Medical illustr[employer].  Poor appetite.  Provided tips to increase calories and protein.  Will mail handout on increasing calories and protein, eating with a poor appetite and coupons for the [employer] and contact information for the [facility].    Will also ask the [facility] to see her when she is here for XRT and chemo.  Antonieta Iba, RD, LDN

## 2014-03-23 ENCOUNTER — Telehealth: Payer: Self-pay | Admitting: *Deleted

## 2014-03-23 ENCOUNTER — Other Ambulatory Visit: Payer: Self-pay | Admitting: Radiation Oncology

## 2014-03-23 DIAGNOSIS — C539 Malignant neoplasm of cervix uteri, unspecified: Secondary | ICD-10-CM

## 2014-03-23 NOTE — Telephone Encounter (Signed)
CALLED PATIENT TO INFORM OF TANDEM, RING PLACEMENT FOR 04-06-14 @ 9:15 AM @ [place], SPOKE WITH PATIENT AND SHE IS AWARE OF THIS PROCEDURE.

## 2014-03-30 ENCOUNTER — Other Ambulatory Visit: Payer: Self-pay | Admitting: Radiation Oncology

## 2014-03-30 DIAGNOSIS — C539 Malignant neoplasm of cervix uteri, unspecified: Secondary | ICD-10-CM

## 2014-04-02 ENCOUNTER — Encounter (HOSPITAL_BASED_OUTPATIENT_CLINIC_OR_DEPARTMENT_OTHER): Payer: Self-pay | Admitting: *Deleted

## 2014-04-02 NOTE — Progress Notes (Addendum)
Pt instructed npo p mn 10/12.  May take pain med if needed w sip of water only. To [facility] 10/13 @ 0745.   Pt to come by Monday before 2pm for labs, cbc w diff, pt ptt.

## 2014-04-03 ENCOUNTER — Other Ambulatory Visit: Payer: Self-pay | Admitting: Radiation Oncology

## 2014-04-05 ENCOUNTER — Encounter: Payer: Self-pay | Admitting: Radiation Oncology

## 2014-04-05 DIAGNOSIS — Z9861 Coronary angioplasty status: Secondary | ICD-10-CM | POA: Diagnosis not present

## 2014-04-05 DIAGNOSIS — I252 Old myocardial infarction: Secondary | ICD-10-CM | POA: Diagnosis not present

## 2014-04-05 DIAGNOSIS — Z9221 Personal history of antineoplastic chemotherapy: Secondary | ICD-10-CM | POA: Diagnosis not present

## 2014-04-05 DIAGNOSIS — Z539 Procedure and treatment not carried out, unspecified reason: Secondary | ICD-10-CM | POA: Diagnosis not present

## 2014-04-05 DIAGNOSIS — Z79899 Other long term (current) drug therapy: Secondary | ICD-10-CM | POA: Diagnosis not present

## 2014-04-05 DIAGNOSIS — C539 Malignant neoplasm of cervix uteri, unspecified: Secondary | ICD-10-CM | POA: Diagnosis not present

## 2014-04-05 DIAGNOSIS — I2510 Atherosclerotic heart disease of native coronary artery without angina pectoris: Secondary | ICD-10-CM | POA: Diagnosis not present

## 2014-04-05 DIAGNOSIS — F17210 Nicotine dependence, cigarettes, uncomplicated: Secondary | ICD-10-CM | POA: Diagnosis not present

## 2014-04-05 DIAGNOSIS — Z923 Personal history of irradiation: Secondary | ICD-10-CM | POA: Diagnosis not present

## 2014-04-05 DIAGNOSIS — E785 Hyperlipidemia, unspecified: Secondary | ICD-10-CM | POA: Diagnosis not present

## 2014-04-05 LAB — CBC WITH DIFFERENTIAL/PLATELET
Basophils Absolute: 0 K/uL (ref 0.0–0.1)
Basophils Relative: 0 % (ref 0–1)
Eosinophils Absolute: 0 K/uL (ref 0.0–0.7)
Eosinophils Relative: 0 % (ref 0–5)
HCT: 38.2 % (ref 36.0–46.0)
Hemoglobin: 12.9 g/dL (ref 12.0–15.0)
Lymphocytes Relative: 17 % (ref 12–46)
Lymphs Abs: 1.3 K/uL (ref 0.7–4.0)
MCH: 32.5 pg (ref 26.0–34.0)
MCHC: 33.8 g/dL (ref 30.0–36.0)
MCV: 96.2 fL (ref 78.0–100.0)
Monocytes Absolute: 0.7 K/uL (ref 0.1–1.0)
Monocytes Relative: 9 % (ref 3–12)
Neutro Abs: 5.8 K/uL (ref 1.7–7.7)
Neutrophils Relative %: 74 % (ref 43–77)
Platelets: 241 K/uL (ref 150–400)
RBC: 3.97 MIL/uL (ref 3.87–5.11)
RDW: 18 % — ABNORMAL HIGH (ref 11.5–15.5)
WBC: 7.8 K/uL (ref 4.0–10.5)

## 2014-04-05 LAB — PROTIME-INR
INR: 0.93 (ref 0.00–1.49)
Prothrombin Time: 12.5 s (ref 11.6–15.2)

## 2014-04-05 LAB — APTT: aPTT: 25 s (ref 24–37)

## 2014-04-05 NOTE — Progress Notes (Signed)
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 female who is diagnosed earlier this year with stage IIIB cervical cancer. She presented with  right hydronephrosis. She has completed her external beam radiation therapy and radiosensitizing chemotherapy at the [facility] in [place]. She received 45 gray to the whole pelvis followed by a boost to the right sidewall area for a cumulative dose to this area of 54 gray. Patient did require admission to the hospital for low blood counts, fever and  Enterobacter sepsis. The patient's PICC line was removed. This resulted in a delay of  treatment for approximately 7-10 days. the patient completed her radiation therapy without further interruption.    PAST MEDICAL HISTORY:  has a past medical history of Breast mass, right; Dressler's syndrome; Coronary atherosclerosis of native coronary artery; Hyperlipidemia; Myocardial infarction; Cervical cancer; Anemia; and Upper respiratory infection with cough and congestion.    PAST SURGICAL HISTORY: Past Surgical History  Procedure Laterality Date  . Breast lumpectomy    . Coronary artery bypass graft  2006    Off pump LIMA to LAD, SVG to PDA  . Tubal ligation      FAMILY HISTORY: family history includes Diabetes in her mother.  SOCIAL HISTORY:  reports that she has been smoking Cigarettes.  She has a 35 pack-year smoking history. She has never used smokeless tobacco. She reports that she does not drink alcohol or use illicit drugs.  ALLERGIES: Review of patient's allergies indicates no known allergies.  MEDICATIONS:  Current Outpatient Prescriptions  Medication Sig  Dispense Refill  . docusate sodium (COLACE) 100 MG capsule Take 1 capsule (100 mg total) by mouth 2 (two) times daily.  30 capsule  0  . ferrous fumarate (HEMOCYTE - 106 MG FE) 325 (106 FE) MG TABS tablet Take 1 tablet by mouth daily.      Marland Kitchen HYDROmorphone (DILAUDID) 2 MG tablet Take 1-2 tablets (2-4 mg total) by mouth every 4 (four) hours as needed for severe pain.  60 tablet  0  . magnesium gluconate (MAGONATE) 500 MG tablet Take 500 mg by mouth daily.      . OxyCODONE (OXYCONTIN) 15 mg T12A 12 hr tablet Take 1 tablet (15 mg total) by mouth every 12 (twelve) hours.  60 tablet  0  . Pseudoeph-Doxylamine-DM-APAP (NYQUIL PO) Take by mouth at bedtime as needed.      . Pseudoephedrine-APAP-DM (DAYQUIL PO) Take by mouth as needed.       No current facility-administered medications for this encounter.    REVIEW OF SYSTEMS:  A 15 point review of systems is documented in the electronic medical record. This was obtained by the nursing staff. However, I reviewed this with the patient to discuss relevant findings and make appropriate changes.  She denies any vaginal bleeding at this time. She has noticed significant improvement in her pain with no prescription pain medication necessary at this time. Patient denies any hematuria or rectal bleeding. The vaginal drainage has improved significantly.   PHYSICAL EXAM: Vital signs October 6 [place]  temperature of 99.0 pulse 96 respirations 16 blood pressure 129/72 height 5 feet 4-1/2 inches weight 110 pounds oxygen saturation  96% on room air   General Appearance:    Alert, cooperative, no distress, appears stated age  Head:    Normocephalic, without obvious abnormality, atraumatic  Eyes:    PERRL, conjunctiva/corneas clear, EOM's intact,         Nose:   Nares normal, septum midline, mucosa normal, no drainage    or sinus tenderness  Throat:   Lips, mucosa, and tongue normal; dentures along the maxillary region, poor dentition along the mandible  with most teeth missing    Neck:   Supple, symmetrical, trachea midline, no adenopathy;    thyroid:  no enlargement/tenderness/nodules; no carotid   bruit or JVD  Back:     Symmetric, no curvature, ROM normal, no CVA tenderness  Lungs:     Clear to auscultation bilaterally, respirations unlabored  Chest Wall:    No tenderness or deformity   Heart:    Regular rate and rhythm, S1 and S2 normal, no murmur, rub   or gallop     Abdomen:     Soft, non-tender, bowel sounds active all four quadrants,    no masses, no organomegaly  Genitalia:    exam last week showed significant shrinkage of the large cervical mass      Extremities:   Extremities normal, atraumatic, no cyanosis or edema  Pulses:   2+ and symmetric all extremities  Skin:   Skin color, texture, turgor normal, no rashes or lesions  Lymph nodes:   Cervical, supraclavicular, and axillary nodes normal  Neurologic:  normal strength, sensation and reflexes    throughout     ECOG = 1    1 - Symptomatic but completely ambulatory (Restricted in physically strenuous activity but ambulatory and able to carry out work of a light or sedentary nature. For example, light housework, office work)  LABORATORY DATA:  Lab Results  Component Value Date   WBC 7.8 04/05/2014   HGB 12.9 04/05/2014   HCT 38.2 04/05/2014   MCV 96.2 04/05/2014   PLT 241 04/05/2014   NEUTROABS 5.8 04/05/2014   Lab Results  Component Value Date   NA 137 02/03/2014   K 3.2* 02/03/2014   CL 97 02/03/2014   CO2 24 02/03/2014   GLUCOSE 95 02/03/2014   CREATININE 0.48* 02/03/2014   CALCIUM 9.1 02/03/2014      RADIOGRAPHY: No results found.    IMPRESSION: Stage IIIB squamous cell carcinoma cervix. Patient is now ready to proceed with intracavitary brachytherapy treatments as part of her overall management  PLAN: The patient will be taken to the operating room on October 13 for exam under anesthesia and placement of a tandem/ring system for high-dose rate radiation  therapy. She is scheduled to receive 5 treatments using intracavitary brachytherapy, iridium 192 as the high-dose-rate source.    ------------------------------------------------  Blair Promise, PhD, MD

## 2014-04-05 NOTE — Progress Notes (Signed)
Verbal order for consent received from Dr Sondra Come.OR aware of prep needed.

## 2014-04-06 ENCOUNTER — Ambulatory Visit: Payer: 59

## 2014-04-06 ENCOUNTER — Encounter (HOSPITAL_BASED_OUTPATIENT_CLINIC_OR_DEPARTMENT_OTHER)
Admission: RE | Disposition: A | Discharge status: Home / Self Care | Payer: Self-pay | Source: Ambulatory Visit | Attending: Radiation Oncology

## 2014-04-06 ENCOUNTER — Ambulatory Visit
Admission: RE | Admit: 2014-04-06 | Discharge: 2014-04-06 | Disposition: A | Discharge status: Home / Self Care | Payer: 59 | Source: Ambulatory Visit | Attending: Radiation Oncology | Admitting: Radiation Oncology

## 2014-04-06 ENCOUNTER — Ambulatory Visit (HOSPITAL_COMMUNITY)
Admission: RE | Admit: 2014-04-06 | Discharge: 2014-04-06 | Disposition: A | Discharge status: Home / Self Care | Payer: Medicaid Other | Source: Ambulatory Visit | Attending: Radiation Oncology | Admitting: Radiation Oncology

## 2014-04-06 ENCOUNTER — Ambulatory Visit (HOSPITAL_BASED_OUTPATIENT_CLINIC_OR_DEPARTMENT_OTHER): Payer: Medicaid Other | Admitting: Anesthesiology

## 2014-04-06 ENCOUNTER — Encounter (HOSPITAL_BASED_OUTPATIENT_CLINIC_OR_DEPARTMENT_OTHER): Payer: Medicaid Other | Admitting: Anesthesiology

## 2014-04-06 ENCOUNTER — Encounter (HOSPITAL_BASED_OUTPATIENT_CLINIC_OR_DEPARTMENT_OTHER): Payer: Self-pay | Admitting: *Deleted

## 2014-04-06 ENCOUNTER — Ambulatory Visit (HOSPITAL_BASED_OUTPATIENT_CLINIC_OR_DEPARTMENT_OTHER)
Admission: RE | Admit: 2014-04-06 | Discharge: 2014-04-06 | Disposition: A | Discharge status: Home / Self Care | Payer: Medicaid Other | Source: Ambulatory Visit | Attending: Radiation Oncology | Admitting: Radiation Oncology

## 2014-04-06 DIAGNOSIS — I252 Old myocardial infarction: Secondary | ICD-10-CM | POA: Insufficient documentation

## 2014-04-06 DIAGNOSIS — F17210 Nicotine dependence, cigarettes, uncomplicated: Secondary | ICD-10-CM | POA: Diagnosis not present

## 2014-04-06 DIAGNOSIS — Z9861 Coronary angioplasty status: Secondary | ICD-10-CM | POA: Insufficient documentation

## 2014-04-06 DIAGNOSIS — E785 Hyperlipidemia, unspecified: Secondary | ICD-10-CM | POA: Insufficient documentation

## 2014-04-06 DIAGNOSIS — C539 Malignant neoplasm of cervix uteri, unspecified: Secondary | ICD-10-CM | POA: Diagnosis not present

## 2014-04-06 DIAGNOSIS — Z923 Personal history of irradiation: Secondary | ICD-10-CM | POA: Insufficient documentation

## 2014-04-06 DIAGNOSIS — Z539 Procedure and treatment not carried out, unspecified reason: Secondary | ICD-10-CM | POA: Insufficient documentation

## 2014-04-06 DIAGNOSIS — I2510 Atherosclerotic heart disease of native coronary artery without angina pectoris: Secondary | ICD-10-CM | POA: Insufficient documentation

## 2014-04-06 DIAGNOSIS — Z9221 Personal history of antineoplastic chemotherapy: Secondary | ICD-10-CM | POA: Insufficient documentation

## 2014-04-06 DIAGNOSIS — Z79899 Other long term (current) drug therapy: Secondary | ICD-10-CM | POA: Insufficient documentation

## 2014-04-06 HISTORY — DX: Anemia, unspecified: D64.9

## 2014-04-06 HISTORY — DX: Acute upper respiratory infection, unspecified: J06.9

## 2014-04-06 HISTORY — PX: TANDEM RING INSERTION: SHX6199

## 2014-04-06 SURGERY — INSERTION, UTERINE TANDEM AND RING OR CYLINDER, FOR BRACHYTHERAPY
Anesthesia: General | Site: Vagina

## 2014-04-06 MED ORDER — PROPOFOL 10 MG/ML IV BOLUS
INTRAVENOUS | Status: DC | PRN
  Administered 2014-04-06: 200 mg via INTRAVENOUS

## 2014-04-06 MED ORDER — KETOROLAC TROMETHAMINE 30 MG/ML IJ SOLN
INTRAMUSCULAR | Status: DC | PRN
  Administered 2014-04-06: 30 mg via INTRAVENOUS

## 2014-04-06 MED ORDER — ONDANSETRON HCL 4 MG/2ML IJ SOLN
INTRAMUSCULAR | Status: DC | PRN
  Administered 2014-04-06: 4 mg via INTRAVENOUS

## 2014-04-06 MED ORDER — MEPERIDINE HCL 25 MG/ML IJ SOLN
6.2500 mg | INTRAMUSCULAR | Status: DC | PRN
  Filled 2014-04-06: qty 1

## 2014-04-06 MED ORDER — SODIUM CHLORIDE 0.9 % IV SOLN
INTRAVENOUS | Status: DC
  Filled 2014-04-06: qty 1000

## 2014-04-06 MED ORDER — MIDAZOLAM HCL 2 MG/2ML IJ SOLN
INTRAMUSCULAR | Status: AC
  Filled 2014-04-06: qty 2

## 2014-04-06 MED ORDER — MIDAZOLAM HCL 5 MG/5ML IJ SOLN
INTRAMUSCULAR | Status: DC | PRN
  Administered 2014-04-06 (×2): 1 mg via INTRAVENOUS

## 2014-04-06 MED ORDER — WATER FOR IRRIGATION, STERILE IR SOLN
Status: DC | PRN
  Administered 2014-04-06: 500 mL via SURGICAL_CAVITY

## 2014-04-06 MED ORDER — LACTATED RINGERS IV SOLN
INTRAVENOUS | Status: DC
  Filled 2014-04-06: qty 1000

## 2014-04-06 MED ORDER — LIDOCAINE HCL (CARDIAC) 20 MG/ML IV SOLN
INTRAVENOUS | Status: DC | PRN
  Administered 2014-04-06: 100 mg via INTRAVENOUS

## 2014-04-06 MED ORDER — FENTANYL CITRATE 0.05 MG/ML IJ SOLN
25.0000 ug | INTRAMUSCULAR | Status: DC | PRN
  Filled 2014-04-06: qty 1

## 2014-04-06 MED ORDER — ACETAMINOPHEN 10 MG/ML IV SOLN
INTRAVENOUS | Status: DC | PRN
  Administered 2014-04-06: 1000 mg via INTRAVENOUS

## 2014-04-06 MED ORDER — FENTANYL CITRATE 0.05 MG/ML IJ SOLN
INTRAMUSCULAR | Status: AC
  Filled 2014-04-06: qty 6

## 2014-04-06 MED ORDER — FENTANYL CITRATE 0.05 MG/ML IJ SOLN
INTRAMUSCULAR | Status: DC | PRN
  Administered 2014-04-06: 25 ug via INTRAVENOUS
  Administered 2014-04-06: 50 ug via INTRAVENOUS
  Administered 2014-04-06 (×5): 25 ug via INTRAVENOUS

## 2014-04-06 MED ORDER — LACTATED RINGERS IV SOLN
INTRAVENOUS | Status: DC
  Administered 2014-04-06: 08:00:00 via INTRAVENOUS
  Filled 2014-04-06: qty 1000

## 2014-04-06 MED ORDER — DEXAMETHASONE SODIUM PHOSPHATE 4 MG/ML IJ SOLN
INTRAMUSCULAR | Status: DC | PRN
  Administered 2014-04-06: 10 mg via INTRAVENOUS

## 2014-04-06 MED ORDER — CEFAZOLIN SODIUM-DEXTROSE 2-3 GM-% IV SOLR
2.0000 g | Freq: Once | INTRAVENOUS | Status: DC
  Filled 2014-04-06: qty 50

## 2014-04-06 MED ORDER — LACTATED RINGERS IV SOLN
INTRAVENOUS | Status: DC | PRN
  Administered 2014-04-06 (×2): via INTRAVENOUS

## 2014-04-06 MED ORDER — PROMETHAZINE HCL 25 MG/ML IJ SOLN
6.2500 mg | INTRAMUSCULAR | Status: DC | PRN
  Filled 2014-04-06: qty 1

## 2014-04-06 SURGICAL SUPPLY — 36 items
BAG URINE DRAINAGE (UROLOGICAL SUPPLIES) ×1 IMPLANT
BNDG CONFORM 2 STRL LF (GAUZE/BANDAGES/DRESSINGS) IMPLANT
CATH FOLEY 2WAY SLVR  5CC 16FR (CATHETERS) ×2
CATH FOLEY 2WAY SLVR 5CC 16FR (CATHETERS) ×1 IMPLANT
CLOTH BEACON ORANGE TIMEOUT ST (SAFETY) IMPLANT
COVER TABLE BACK 60X90 (DRAPES) ×3 IMPLANT
DRAPE LG THREE QUARTER DISP (DRAPES) ×3 IMPLANT
DRAPE UNDERBUTTOCKS STRL (DRAPE) ×3 IMPLANT
GLOVE BIO SURGEON STRL SZ 6.5 (GLOVE) ×2 IMPLANT
GLOVE BIO SURGEON STRL SZ7.5 (GLOVE) ×6 IMPLANT
GLOVE BIO SURGEONS STRL SZ 6.5 (GLOVE) ×1
GLOVE BIOGEL PI IND STRL 6.5 (GLOVE) IMPLANT
GLOVE BIOGEL PI INDICATOR 6.5 (GLOVE) ×2
GOWN STRL REUS W/ TWL LRG LVL3 (GOWN DISPOSABLE) ×2 IMPLANT
GOWN STRL REUS W/TWL LRG LVL3 (GOWN DISPOSABLE) ×6
HOLDER FOLEY CATH W/STRAP (MISCELLANEOUS) IMPLANT
LEGGING LITHOTOMY PAIR STRL (DRAPES) ×3 IMPLANT
NDL SPNL 22GX3.5 QUINCKE BK (NEEDLE) IMPLANT
NEEDLE SPNL 22GX3.5 QUINCKE BK (NEEDLE) IMPLANT
PACK BASIN DAY SURGERY FS (CUSTOM PROCEDURE TRAY) ×3 IMPLANT
PACKING VAGINAL (PACKING) IMPLANT
PAD ABD 8X10 STRL (GAUZE/BANDAGES/DRESSINGS) IMPLANT
PAD OB MATERNITY 4.3X12.25 (PERSONAL CARE ITEMS) ×3 IMPLANT
PAD PREP 24X48 CUFFED NSTRL (MISCELLANEOUS) ×3 IMPLANT
PLUG CATH AND CAP STER (CATHETERS) IMPLANT
SET IRRIG Y TYPE TUR BLADDER L (SET/KITS/TRAYS/PACK) ×3 IMPLANT
SUT PROLENE 0 SH 30 (SUTURE) IMPLANT
SUT SILK 2 0 30  PSL (SUTURE)
SUT SILK 2 0 30 PSL (SUTURE) IMPLANT
SYR BULB IRRIGATION 50ML (SYRINGE) IMPLANT
SYR CONTROL 10ML LL (SYRINGE) ×3 IMPLANT
SYRINGE 10CC LL (SYRINGE) ×3 IMPLANT
TOWEL OR 17X24 6PK STRL BLUE (TOWEL DISPOSABLE) ×6 IMPLANT
TRAY DSU PREP LF (CUSTOM PROCEDURE TRAY) ×3 IMPLANT
WATER STERILE IRR 3000ML UROMA (IV SOLUTION) ×3 IMPLANT
WATER STERILE IRR 500ML POUR (IV SOLUTION) ×3 IMPLANT

## 2014-04-06 NOTE — Op Note (Signed)
04/06/2014  11:14 AM  PATIENT:  [name]  48 y.o. 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 The patient was prepped and draped in the usual sterile fashion and placed in the dorsal lithotomy position. A Foley catheter was placed under sterile conditions.  The patient proceeded to undergo exam under anesthesia. The cervical os was retracted to the right as noted on previous exams. On bimanual and rectovaginal examination the cervix/uterus was estimated to be approximately 3.5 x 4 cm in size down from a pretreatment measurement of approximately 8 cm.  The bladder was filled with approximately 200 cc of sterile water for ultrasound imaging purposes.  The patient then proceeded to undergo attempted dilation of the cervix and sounding of the uterus. Despite multiple attempts due to a fibrotic cervix region,  the cervical os could not be dilated and the uterus could not be sounded. The planned placement of tandem and ring for high-dose rate radiation therapy therefore could not occur. The patient was removed from general anesthesia and transported to the recovery room in stable condition.  The patient will be taken back to the operating room at a later date with gynecologic oncology in an  attempt to dilate the cervical os then placement of the tandem and ring for high-dose rate radiation therapy. The other option for the patient would be to proceed with inpatient low dose rate cesium application at [facility].  PLAN OF CARE: Discharge to home after PACU  PATIENT DISPOSITION:  PACU - hemodynamically stable.   Delay start of Pharmacological VTE agent (>24hrs) due to surgical blood loss or risk of bleeding: not applicable

## 2014-04-06 NOTE — Discharge Instructions (Signed)

## 2014-04-06 NOTE — H&P (View-Only) (Signed)
Radiation Oncology         [phone] ________________________________  Pre-Operative Note  Name: [name] MRN: [id number]  Date: 04/06/2014  DOB: Apr 13, 1966  ZJ:IRCVELFYB,OFBP S, MD  Everitt Amber, MD   REFERRING PHYSICIAN: Everitt Amber, MD  DIAGNOSIS: Stage III-B squamous cell carcinoma of the cervix  HISTORY OF PRESENT ILLNESS::[name] is a 48 y.o. female who is diagnosed earlier this year with stage IIIB cervical cancer. She presented with  right hydronephrosis. She has completed her external beam radiation therapy and radiosensitizing chemotherapy at the [facility] in [place]. She received 45 gray to the whole pelvis followed by a boost to the right sidewall area for a cumulative dose to this area of 54 gray. Patient did require admission to the hospital for low blood counts, fever and  Enterobacter sepsis. The patient's PICC line was removed. This resulted in a delay of  treatment for approximately 7-10 days. the patient completed her radiation therapy without further interruption.    PAST MEDICAL HISTORY:  has a past medical history of Breast mass, right; Dressler's syndrome; Coronary atherosclerosis of native coronary artery; Hyperlipidemia; Myocardial infarction; Cervical cancer; Anemia; and Upper respiratory infection with cough and congestion.    PAST SURGICAL HISTORY: Past Surgical History  Procedure Laterality Date  . Breast lumpectomy    . Coronary artery bypass graft  2006    Off pump LIMA to LAD, SVG to PDA  . Tubal ligation      FAMILY HISTORY: family history includes Diabetes in her mother.  SOCIAL HISTORY:  reports that she has been smoking Cigarettes.  She has a 35 pack-year smoking history. She has never used smokeless tobacco. She reports that she does not drink alcohol or use illicit drugs.  ALLERGIES: Review of patient's allergies indicates no known allergies.  MEDICATIONS:  Current Outpatient Prescriptions  Medication Sig  Dispense Refill  . docusate sodium (COLACE) 100 MG capsule Take 1 capsule (100 mg total) by mouth 2 (two) times daily.  30 capsule  0  . ferrous fumarate (HEMOCYTE - 106 MG FE) 325 (106 FE) MG TABS tablet Take 1 tablet by mouth daily.      Marland Kitchen HYDROmorphone (DILAUDID) 2 MG tablet Take 1-2 tablets (2-4 mg total) by mouth every 4 (four) hours as needed for severe pain.  60 tablet  0  . magnesium gluconate (MAGONATE) 500 MG tablet Take 500 mg by mouth daily.      . OxyCODONE (OXYCONTIN) 15 mg T12A 12 hr tablet Take 1 tablet (15 mg total) by mouth every 12 (twelve) hours.  60 tablet  0  . Pseudoeph-Doxylamine-DM-APAP (NYQUIL PO) Take by mouth at bedtime as needed.      . Pseudoephedrine-APAP-DM (DAYQUIL PO) Take by mouth as needed.       No current facility-administered medications for this encounter.    REVIEW OF SYSTEMS:  A 15 point review of systems is documented in the electronic medical record. This was obtained by the nursing staff. However, I reviewed this with the patient to discuss relevant findings and make appropriate changes.  She denies any vaginal bleeding at this time. She has noticed significant improvement in her pain with no prescription pain medication necessary at this time. Patient denies any hematuria or rectal bleeding. The vaginal drainage has improved significantly.   PHYSICAL EXAM: Vital signs October 6 [place]  temperature of 99.0 pulse 96 respirations 16 blood pressure 129/72 height 5 feet 4-1/2 inches weight 110 pounds oxygen saturation  96% on room air   General Appearance:    Alert, cooperative, no distress, appears stated age  Head:    Normocephalic, without obvious abnormality, atraumatic  Eyes:    PERRL, conjunctiva/corneas clear, EOM's intact,         Nose:   Nares normal, septum midline, mucosa normal, no drainage    or sinus tenderness  Throat:   Lips, mucosa, and tongue normal; dentures along the maxillary region, poor dentition along the mandible  with most teeth missing    Neck:   Supple, symmetrical, trachea midline, no adenopathy;    thyroid:  no enlargement/tenderness/nodules; no carotid   bruit or JVD  Back:     Symmetric, no curvature, ROM normal, no CVA tenderness  Lungs:     Clear to auscultation bilaterally, respirations unlabored  Chest Wall:    No tenderness or deformity   Heart:    Regular rate and rhythm, S1 and S2 normal, no murmur, rub   or gallop     Abdomen:     Soft, non-tender, bowel sounds active all four quadrants,    no masses, no organomegaly  Genitalia:    exam last week showed significant shrinkage of the large cervical mass      Extremities:   Extremities normal, atraumatic, no cyanosis or edema  Pulses:   2+ and symmetric all extremities  Skin:   Skin color, texture, turgor normal, no rashes or lesions  Lymph nodes:   Cervical, supraclavicular, and axillary nodes normal  Neurologic:  normal strength, sensation and reflexes    throughout     ECOG = 1    1 - Symptomatic but completely ambulatory (Restricted in physically strenuous activity but ambulatory and able to carry out work of a light or sedentary nature. For example, light housework, office work)  LABORATORY DATA:  Lab Results  Component Value Date   WBC 7.8 04/05/2014   HGB 12.9 04/05/2014   HCT 38.2 04/05/2014   MCV 96.2 04/05/2014   PLT 241 04/05/2014   NEUTROABS 5.8 04/05/2014   Lab Results  Component Value Date   NA 137 02/03/2014   K 3.2* 02/03/2014   CL 97 02/03/2014   CO2 24 02/03/2014   GLUCOSE 95 02/03/2014   CREATININE 0.48* 02/03/2014   CALCIUM 9.1 02/03/2014      RADIOGRAPHY: No results found.    IMPRESSION: Stage IIIB squamous cell carcinoma cervix. Patient is now ready to proceed with intracavitary brachytherapy treatments as part of her overall management  PLAN: The patient will be taken to the operating room on October 13 for exam under anesthesia and placement of a tandem/ring system for high-dose rate radiation  therapy. She is scheduled to receive 5 treatments using intracavitary brachytherapy, iridium 192 as the high-dose-rate source.    ------------------------------------------------  Blair Promise, PhD, MD

## 2014-04-06 NOTE — Interval H&P Note (Signed)
History and Physical Interval Note:  04/06/2014 9:24 AM  [name]  has presented today for surgery, with the diagnosis of CERVICAL CANCER   The various methods of treatment have been discussed with the patient and family. After consideration of risks, benefits and other options for treatment, the patient has consented to  Procedure(s): TANDEM RING INSERTION (N/A) as a surgical intervention .  The patient's history has been reviewed, patient examined, no change in status, stable for surgery.  I have reviewed the patient's chart and labs.  Questions were answered to the patient's satisfaction.     Gery Pray D

## 2014-04-06 NOTE — Anesthesia Procedure Notes (Signed)
Procedure Name: LMA Insertion Date/Time: 04/06/2014 9:19 AM Performed by: Justice Rocher Pre-anesthesia Checklist: Patient identified, Emergency Drugs available, Suction available and Patient being monitored Patient Re-evaluated:Patient Re-evaluated prior to inductionOxygen Delivery Method: Circle System Utilized Preoxygenation: Pre-oxygenation with 100% oxygen Intubation Type: IV induction Ventilation: Mask ventilation without difficulty LMA: LMA inserted LMA Size: 4.0 Number of attempts: 1 Airway Equipment and Method: bite block Placement Confirmation: positive ETCO2 Tube secured with: Tape Dental Injury: Teeth and Oropharynx as per pre-operative assessment

## 2014-04-06 NOTE — Anesthesia Preprocedure Evaluation (Addendum)
Anesthesia Evaluation  Patient identified by MRN, date of birth, ID band Patient awake    Reviewed: Allergy & Precautions, H&P , NPO status , Patient's Chart, lab work & pertinent test results  Airway Mallampati: II TM Distance: >3 FB Neck ROM: Full    Dental no notable dental hx. (+) Upper Dentures, Missing, Edentulous Upper   Pulmonary Current Smoker,  breath sounds clear to auscultation  Pulmonary exam normal       Cardiovascular + CAD, + Past MI and + CABG Rhythm:Regular Rate:Normal     Neuro/Psych negative neurological ROS  negative psych ROS   GI/Hepatic negative GI ROS, Neg liver ROS,   Endo/Other  negative endocrine ROS  Renal/GU negative Renal ROS  negative genitourinary   Musculoskeletal negative musculoskeletal ROS (+)   Abdominal   Peds negative pediatric ROS (+)  Hematology negative hematology ROS (+)   Anesthesia Other Findings   Reproductive/Obstetrics negative OB ROS                          Anesthesia Physical Anesthesia Plan  ASA: III  Anesthesia Plan: General   Post-op Pain Management:    Induction: Intravenous  Airway Management Planned: LMA and Oral ETT  Additional Equipment:   Intra-op Plan:   Post-operative Plan: Extubation in OR  Informed Consent: I have reviewed the patients History and Physical, chart, labs and discussed the procedure including the risks, benefits and alternatives for the proposed anesthesia with the patient or authorized representative who has indicated his/her understanding and acceptance.   Dental advisory given  Plan Discussed with: CRNA  Anesthesia Plan Comments:         Anesthesia Quick Evaluation

## 2014-04-06 NOTE — Transfer of Care (Signed)
Immediate Anesthesia Transfer of Care Note  Patient: [name]  Procedure(s) Performed: Procedure(s) (LRB): exam under anesthesia, attempted TANDEM RING INSERTION (N/A)  Patient Location: PACU  Anesthesia Type: General  Level of Consciousness: awake, sedated, patient cooperative and responds to stimulation  Airway & Oxygen Therapy: Patient Spontanous Breathing and Patient connected to face mask oxygen  Post-op Assessment: Report given to PACU RN, Post -op Vital signs reviewed and stable and Patient moving all extremities  Post vital signs: Reviewed and stable  Complications: No apparent anesthesia complications

## 2014-04-07 ENCOUNTER — Encounter (HOSPITAL_BASED_OUTPATIENT_CLINIC_OR_DEPARTMENT_OTHER): Payer: Self-pay | Admitting: Radiation Oncology

## 2014-04-07 NOTE — Anesthesia Postprocedure Evaluation (Signed)
  Anesthesia Post-op Note  Patient: [name]  Procedure(s) Performed: Procedure(s) (LRB): exam under anesthesia, attempted TANDEM RING INSERTION (N/A)  Patient Location: PACU  Anesthesia Type: General  Level of Consciousness: awake and alert   Airway and Oxygen Therapy: Patient Spontanous Breathing  Post-op Pain: mild  Post-op Assessment: Post-op Vital signs reviewed, Patient's Cardiovascular Status Stable, Respiratory Function Stable, Patent Airway and No signs of Nausea or vomiting  Last Vitals:  Filed Vitals:   04/06/14 1124  BP: 136/66  Pulse: 79  Temp: 36.4 C  Resp: 18    Post-op Vital Signs: stable   Complications: No apparent anesthesia complications

## 2014-04-14 ENCOUNTER — Ambulatory Visit (HOSPITAL_COMMUNITY): Admission: RE | Admit: 2014-04-14 | Payer: 59 | Source: Ambulatory Visit

## 2014-04-14 ENCOUNTER — Ambulatory Visit: Payer: 59 | Admitting: Radiation Oncology

## 2014-04-14 ENCOUNTER — Encounter (HOSPITAL_BASED_OUTPATIENT_CLINIC_OR_DEPARTMENT_OTHER): Admission: RE | Payer: Self-pay | Source: Ambulatory Visit

## 2014-04-14 ENCOUNTER — Ambulatory Visit (HOSPITAL_BASED_OUTPATIENT_CLINIC_OR_DEPARTMENT_OTHER): Admission: RE | Admit: 2014-04-14 | Payer: Medicaid Other | Source: Ambulatory Visit | Admitting: Radiation Oncology

## 2014-04-14 SURGERY — INSERTION, UTERINE TANDEM AND RING OR CYLINDER, FOR BRACHYTHERAPY
Anesthesia: General

## 2014-04-21 DIAGNOSIS — Z029 Encounter for administrative examinations, unspecified: Secondary | ICD-10-CM

## 2014-05-25 ENCOUNTER — Telehealth: Payer: Self-pay | Admitting: Oncology

## 2014-05-25 NOTE — Telephone Encounter (Signed)
Called [name] about her refill request for OxyContin.  Her phone is not accepting messages.

## 2014-05-28 ENCOUNTER — Telehealth: Payer: Self-pay | Admitting: Oncology

## 2014-05-28 NOTE — Telephone Encounter (Signed)
Called [facility] and there was no answer.

## 2014-06-21 ENCOUNTER — Telehealth: Payer: Self-pay | Admitting: *Deleted

## 2014-06-21 ENCOUNTER — Encounter: Payer: Self-pay | Admitting: *Deleted

## 2014-06-21 NOTE — Telephone Encounter (Signed)
error 

## 2014-06-21 NOTE — Progress Notes (Signed)
Submitted a Prior Approval Narcotic Analgesic request to [facility] fax number [phone] regarding prescription for Oxycontin 15 mg tablet, take one tablet two times a day, 30 day supply (60 tablets) per Dr. Valere Dross.  Called [phone], spoke with Herbie Baltimore to confirm fax was received.  "Herbie Baltimore" states due to it being a first day back from a holiday, there are approximately 200 faxes and to call again tomorrow to make sure it was received.

## 2014-06-22 ENCOUNTER — Telehealth: Payer: Self-pay | Admitting: *Deleted

## 2014-06-22 NOTE — Telephone Encounter (Signed)
Called the [phone] and spoke with Caryl Pina, the faxed request pre-authorization for Oxycontin has been approved, #[id number], and the reference number is#I [id number], thanked Caryl Pina and then called [facility] at [phone], spoke with Sapana,pharmacist she has gotten the pre-authorization for the Oxycontin and she will call the patient today,thanked Sapana,will inbasket,Dr.Kinard and Santiago Glad Hess,RN 2:05 PM

## 2014-08-06 ENCOUNTER — Ambulatory Visit (INDEPENDENT_AMBULATORY_CARE_PROVIDER_SITE_OTHER): Payer: Medicaid Other | Admitting: Nurse Practitioner

## 2014-08-06 ENCOUNTER — Encounter: Payer: Self-pay | Admitting: Nurse Practitioner

## 2014-08-06 VITALS — BP 124/74 | HR 78 | Temp 97.5°F | Ht 64.0 in | Wt 131.0 lb

## 2014-08-06 DIAGNOSIS — F17200 Nicotine dependence, unspecified, uncomplicated: Secondary | ICD-10-CM

## 2014-08-06 DIAGNOSIS — D62 Acute posthemorrhagic anemia: Secondary | ICD-10-CM

## 2014-08-06 DIAGNOSIS — Z720 Tobacco use: Secondary | ICD-10-CM

## 2014-08-06 DIAGNOSIS — I2510 Atherosclerotic heart disease of native coronary artery without angina pectoris: Secondary | ICD-10-CM

## 2014-08-06 DIAGNOSIS — E785 Hyperlipidemia, unspecified: Secondary | ICD-10-CM

## 2014-08-06 NOTE — Patient Instructions (Signed)

## 2014-08-06 NOTE — Progress Notes (Signed)
   Subjective:    Patient ID: [name], female    DOB: 1966/03/15, 49 y.o.   MRN: [id number]  HPI Patient here today to reestablish care. She has not been seen by PCP in awhile- Was diagnosed with cervical cancer and had her last radiation treatment on October 31,2015. SHe did not have a hysterectomy- has follow up with oncology in 1 month. They told her at last visit that she needed to get her a PCP. Current medical problems include: -Hyperlipidemia- does not take any meds for it- not watching diet- no exercise - CAD- has not seen cardiologist in several years- denies any chest pain or SOB -Smoker- 1 pk a day- denies coughing -anemia- was found while in hospital   Review of Systems  Constitutional: Negative.   HENT: Negative.   Respiratory: Negative.   Cardiovascular: Negative.   Genitourinary: Negative.   Neurological: Negative.   Psychiatric/Behavioral: Negative.   All other systems reviewed and are negative.      Objective:   Physical Exam  Constitutional: She is oriented to person, place, and time. She appears well-developed and well-nourished.  HENT:  Nose: Nose normal.  Mouth/Throat: Oropharynx is clear and moist.  Eyes: EOM are normal.  Neck: Trachea normal, normal range of motion and full passive range of motion without pain. Neck supple. No JVD present. Carotid bruit is not present. No thyromegaly present.  Cardiovascular: Normal rate, regular rhythm, normal heart sounds and intact distal pulses.  Exam reveals no gallop and no friction rub.   No murmur heard. bil carotid bruits  Pulmonary/Chest: Effort normal and breath sounds normal.  Abdominal: Soft. Bowel sounds are normal. She exhibits no distension and no mass. There is no tenderness.  Musculoskeletal: Normal range of motion.  Lymphadenopathy:    She has no cervical adenopathy.  Neurological: She is alert and oriented to person, place, and time. She has normal reflexes.  Skin: Skin is warm and dry.    Psychiatric: She has a normal mood and affect. Her behavior is normal. Judgment and thought content normal.    BP 124/74 mmHg  Pulse 78  Temp(Src) 97.5 F (36.4 C) (Oral)  Ht _0  (1.626 m)  Wt 131 lb (59.421 kg)  BMI 22.47 kg/m2       Assessment & Plan:  1. Hyperlipidemia with target LDL less than 100 Low fat diet Will decide on meds once labs are back - CMP14+EGFR - NMR, lipoprofile  2. CIGARETTE SMOKER Smoking cessation encouraged  3. Atherosclerosis of native coronary artery of native heart without angina pectoris  4. Acute blood loss anemia - Anemia Profile B   Patient will schedule mammogram Going to hold off on colonoscopy due to radiation from cervical cancer Labs pending Health maintenance reviewed Diet and exercise encouraged Continue all meds Follow up  In 6 months   Dukes, FNP

## 2014-08-09 LAB — NMR, LIPOPROFILE
CHOLESTEROL: 197 mg/dL (ref 100–199)
HDL Cholesterol by NMR: 44 mg/dL (ref >39)
HDL Particle Number: 27.1 umol/L — ABNORMAL LOW (ref >=30.5)
LDL Particle Number: 1611 nmol/L — ABNORMAL HIGH (ref <1000)
LDL SIZE: 21.4 nm (ref >20.5)
LDL-C: 138 mg/dL — AB (ref 0–99)
LP-IR Score: 34 (ref <=45)
Small LDL Particle Number: 531 nmol/L — ABNORMAL HIGH (ref <=527)
Triglycerides by NMR: 73 mg/dL (ref 0–149)

## 2014-08-09 LAB — CMP14+EGFR
ALT: 14 IU/L (ref 0–32)
AST: 14 IU/L (ref 0–40)
Albumin/Globulin Ratio: 1.8 (ref 1.1–2.5)
Albumin: 4.2 g/dL (ref 3.5–5.5)
Alkaline Phosphatase: 90 IU/L (ref 39–117)
BUN/Creatinine Ratio: 11 (ref 9–23)
BUN: 7 mg/dL (ref 6–24)
Bilirubin Total: <0.2 mg/dL (ref 0.0–1.2)
CO2: 26 mmol/L (ref 18–29)
CREATININE: 0.66 mg/dL (ref 0.57–1.00)
Calcium: 9.8 mg/dL (ref 8.7–10.2)
Chloride: 103 mmol/L (ref 97–108)
GFR calc Af Amer: 119 mL/min/{1.73_m2} (ref >59)
GFR calc non Af Amer: 103 mL/min/{1.73_m2} (ref >59)
GLUCOSE: 101 mg/dL — AB (ref 65–99)
Globulin, Total: 2.4 g/dL (ref 1.5–4.5)
Potassium: 4.4 mmol/L (ref 3.5–5.2)
Sodium: 143 mmol/L (ref 134–144)
Total Protein: 6.6 g/dL (ref 6.0–8.5)

## 2014-08-09 LAB — ANEMIA PROFILE B
FOLATE: 12.6 ng/mL (ref >3.0)
Ferritin: 109 ng/mL (ref 15–150)
Iron Saturation: 21 % (ref 15–55)
Iron: 61 ug/dL (ref 27–159)
Total Iron Binding Capacity: 292 ug/dL (ref 250–450)
UIBC: 231 ug/dL (ref 131–425)
VITAMIN B 12: 247 pg/mL (ref 211–946)

## 2014-12-30 ENCOUNTER — Encounter: Payer: Self-pay | Admitting: Gynecologic Oncology

## 2014-12-30 ENCOUNTER — Ambulatory Visit: Payer: Medicaid Other | Attending: Gynecologic Oncology | Admitting: Gynecologic Oncology

## 2014-12-30 VITALS — BP 148/87 | HR 100 | Resp 18 | Ht 64.0 in | Wt 123.4 lb

## 2014-12-30 DIAGNOSIS — C539 Malignant neoplasm of cervix uteri, unspecified: Secondary | ICD-10-CM

## 2014-12-30 NOTE — Patient Instructions (Signed)
We will see you back in our clinic in December, after your September visit with Dr. Sondra Come. If you have any questions or concerns in the meantime, please give [place] a call.

## 2014-12-30 NOTE — Progress Notes (Signed)
Consult Note: Gyn-Onc   [name] 49 y.o. female with history of cervical cancer (stage IIB)  CC:  Chief Complaint  Patient presents with  . Cervical Cancer  abnormal surveillance pap (ASCUS, +HRHPV).  Assessment/Plan:  Ms. ANYIA GIERKE  is a 49 y.o.  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 She completed primary chemoradiation in October 2015. She had an abnormal surveillance pap with Dr Sondra Come in June, 2016 (ASCUS with +HRHPV). She is otherwise NED on today's exam.  I discussed with the patient that abnormal Pap smears, particularly ASCUS, are extremely common after pelvic radiation. It is our societies guidelines do not to perform surveillance Pap smear screening following radiation for cervical cancer. If performed as they should be performed no more frequently than annually as part of screening for subsequent new onset HPV related disease.  I recommend follow-up visit with Dr. Sondra Come as scheduled in October 2016, and follow-up with me 3 months thereafter.  HPI: The patient is a 48 year old woman who was initially seen in August 2015 for clinical stage IIB moderately differentiated squamous cell carcinoma of the cervix. PET/CT was negative for distant metastatic disease. She underwent primary chemoradiation with Dr. Sondra Come which was completed in October 2015. She was NED following completion of therapy. 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  Interval History: She denies vaginal bleeding or discharge. She is sexually active. She has intermittent low back pain and left lower leg lateral pains. Pap smear performed by Dr. 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  Current Meds:  Outpatient Encounter Prescriptions as of 12/30/2014  Medication Sig  . aspirin 81 MG tablet Take 81 mg by mouth daily.  . Multiple Vitamin  (MULTIVITAMIN) tablet Take 1 tablet by mouth daily.  . [DISCONTINUED] HYDROmorphone (DILAUDID) 2 MG tablet    No facility-administered encounter medications on file as of 12/30/2014.    Allergy: No Known Allergies  Social Hx:   History   Social History  . Marital Status: Single    Spouse Name: N/A  . Number of Children: 4  . Years of Education: N/A   Occupational History  . Not on file.   Social History Main Topics  . Smoking status: Current Every Day Smoker -- 1.00 packs/day for 35 years    Types: Cigarettes  . Smokeless tobacco: Never Used  . Alcohol Use: Yes     Comment: occasional beer every now and then  . Drug Use: No  . Sexual Activity: Not on file   Other Topics Concern  . Not on file   Social History Narrative    Past Surgical Hx:  Past Surgical History  Procedure Laterality Date  . Breast lumpectomy    . Coronary artery bypass graft  2006    Off pump LIMA to LAD, SVG to PDA  . Tubal ligation    . Tandem ring insertion N/A 04/06/2014    Procedure: exam under anesthesia, attempted TANDEM Westervelt;  Surgeon: Blair Promise, MD;  Location: [facility];  Service: Urology;  Laterality: N/A;    Past Medical Hx:  Past Medical History  Diagnosis Date  . Breast mass, right     Benign fibroid adenoma  . 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 Coronary atherosclerosis of native coronary artery     90% LAD ,100%RCA, LVEF  50-55%  . Hyperlipidemia   . Myocardial infarction     NSTEMI 5/06  . Cervical cancer     squamous cell- stage IIB  . Anemia   . Upper respiratory infection with cough and congestion     Past Gynecological History:  SVD x 4, scant gynecologic care (last pap 10-20 years ago). Hx of tubal ligation.  No LMP recorded. Patient is not currently having periods (Reason: Chemotherapy).  Family Hx:  Family History  Problem Relation Age of Onset  . Diabetes Mother     Review of Systems:  Constitutional  Feels fatigue which  has been progressive, + weightloss  ENT Normal appearing ears and nares bilaterally Skin/Breast  No rash, sores, jaundice, itching, dryness Cardiovascular  No chest pain. + SOB on exertion. Can walk up a flight of stairs. Can lay flat. Pulmonary  No cough or wheeze.  Gastro Intestinal  No nausea, vomitting, or diarrhoea. No bright red blood per rectum, no abdominal pain, change in bowel movement, or constipation.  Genito Urinary  No frequency, urgency, dysuria, see HPI. Musculo Skeletal  No myalgia, arthralgia, joint swelling or pain  Neurologic  No weakness, numbness, change in gait,  Psychology  No depression, anxiety, insomnia.   Vitals:  Blood pressure 148/87, pulse 100, resp. rate 18, height 5\' 4"  (1.626 m), weight 123 lb 6.4 oz (55.974 kg), SpO2 100 %.  Physical Exam: WD in NAD Neck  Supple NROM, without any enlargements.  Lymph Node Survey No cervical supraclavicular or inguinal adenopathy - shotty right inguinal nodes palpable. Cardiovascular  Pulse normal rate, regularity and rhythm. S1 and S2 normal.  Lungs  Clear to auscultation bilateraly, without wheezes/crackles/rhonchi. Good air movement.  Skin  No rash/lesions/breakdown  Psychiatry  Alert and oriented to person, place, and time  Abdomen  Normoactive bowel sounds, abdomen soft, non-tender and thin without evidence of hernia. Back No CVA tenderness Genito Urinary  Vulva/vagina: Normal external female genitalia.  No lesions. + discharge and bleeding emanating from vagina.  Bladder/urethra:  No lesions or masses, well supported bladder  Vagina: Radiation changes, no lesions.  Cervix: Radiation changes, no lesions or abnormalities. Agglutination with vagina.  Uterus: Small, mobile, no parametrial involvement or nodularity.  Adnexa: No apparent masses. Rectal  No uterosacral lesions or nodularity. Extremities  No bilateral cyanosis, clubbing or edema.  Donaciano Eva, MD  12/30/2014, 2:27  PM

## 2015-05-03 ENCOUNTER — Telehealth: Payer: Self-pay | Admitting: *Deleted

## 2015-05-03 NOTE — Telephone Encounter (Signed)
Notified patient of appointment change.Pt is scheduled to see Dr. Denman George  On 08/07/2014. Pt agreed with time and date

## 2015-06-06 ENCOUNTER — Ambulatory Visit: Payer: Medicaid Other | Admitting: Gynecologic Oncology

## 2015-06-22 ENCOUNTER — Ambulatory Visit (INDEPENDENT_AMBULATORY_CARE_PROVIDER_SITE_OTHER): Payer: Medicaid Other | Admitting: Physician Assistant

## 2015-06-22 ENCOUNTER — Encounter: Payer: Self-pay | Admitting: Physician Assistant

## 2015-06-22 VITALS — BP 137/80 | HR 93 | Temp 97.5°F | Ht 64.0 in | Wt 130.4 lb

## 2015-06-22 DIAGNOSIS — N760 Acute vaginitis: Secondary | ICD-10-CM | POA: Diagnosis not present

## 2015-06-22 DIAGNOSIS — R1031 Right lower quadrant pain: Secondary | ICD-10-CM

## 2015-06-22 DIAGNOSIS — B9689 Other specified bacterial agents as the cause of diseases classified elsewhere: Secondary | ICD-10-CM

## 2015-06-22 DIAGNOSIS — A499 Bacterial infection, unspecified: Secondary | ICD-10-CM

## 2015-06-22 DIAGNOSIS — R112 Nausea with vomiting, unspecified: Secondary | ICD-10-CM

## 2015-06-22 LAB — POCT URINALYSIS DIPSTICK
Bilirubin, UA: NEGATIVE
GLUCOSE UA: NEGATIVE
Ketones, UA: NEGATIVE
Nitrite, UA: NEGATIVE
Protein, UA: NEGATIVE
Spec Grav, UA: 1.02
Urobilinogen, UA: NEGATIVE
pH, UA: 6

## 2015-06-22 LAB — POCT UA - MICROSCOPIC ONLY
Bacteria, U Microscopic: NEGATIVE
CASTS, UR, LPF, POC: NEGATIVE
CRYSTALS, UR, HPF, POC: NEGATIVE
Mucus, UA: NEGATIVE
Yeast, UA: NEGATIVE

## 2015-06-22 MED ORDER — METRONIDAZOLE 500 MG PO TABS: 500.0000 mg | ORAL_TABLET | Freq: Two times a day (BID) | ORAL | Status: AC

## 2015-06-22 NOTE — Progress Notes (Signed)
Subjective:     Patient ID: [name], female   DOB: 1966-06-20, 49 y.o.   MRN: [id number]  Abdominal Pain Associated symptoms include frequency, headaches, nausea and vomiting. Pertinent negatives include no constipation, diarrhea, dysuria, fever or hematuria.  Headache  Associated symptoms include abdominal pain, nausea and vomiting. Pertinent negatives include no fever.  Emesis  Associated symptoms include abdominal pain and headaches. Pertinent negatives include no diarrhea or fever.   Pt with lower abd pain, polyuria, and N/V with eating for the last 2 days  Review of Systems  Constitutional: Positive for appetite change. Negative for fever, activity change and fatigue.  Respiratory: Negative.   Cardiovascular: Negative.   Gastrointestinal: Positive for nausea, vomiting and abdominal pain. Negative for diarrhea, constipation and abdominal distention.  Genitourinary: Positive for frequency. Negative for dysuria, hematuria, flank pain, vaginal discharge, difficulty urinating, menstrual problem and pelvic pain.  Neurological: Positive for headaches.       Objective:   Physical Exam  Constitutional: She appears well-developed and well-nourished.  HENT:  Mouth/Throat: Oropharynx is clear and moist. No oropharyngeal exudate.  Neck: Neck supple.  Cardiovascular: Normal rate, regular rhythm and normal heart sounds.   No murmur heard. Pulmonary/Chest: Effort normal and breath sounds normal.  Abdominal: Soft. Bowel sounds are normal. She exhibits no distension and no mass. There is tenderness. There is no rebound and no guarding.  Generalized lower quad TTP No CVAT  Lymphadenopathy:    She has no cervical adenopathy.  Nursing note and vitals reviewed.  Results for orders placed or performed in visit on 06/22/15  POCT UA - Microscopic Only  Result Value Ref Range   WBC, Ur, HPF, POC 5-10    RBC, urine, microscopic 10-12    Bacteria, U Microscopic neg    Mucus, UA neg    Epithelial cells, urine per micros occ    Crystals, Ur, HPF, POC neg    Casts, Ur, LPF, POC neg    Yeast, UA neg   POCT urinalysis dipstick  Result Value Ref Range   Color, UA yellow    Clarity, UA cloudy    Glucose, UA neg    Bilirubin, UA neg    Ketones, UA neg    Spec Grav, UA 1.020    Blood, UA large    pH, UA 6.0    Protein, UA neg    Urobilinogen, UA negative    Nitrite, UA neg    Leukocytes, UA Trace (A) Negative   Clue cells also present in UA    Assessment:     Bacterial vaginosis Nausea with vomiting Lower quad abd pain    Plan:     Hydrate well Due to N/V no caff /dairy products Bland diet Flagyl 500mg  bid x 1 wk If sx continue and given hx pt to f/u

## 2015-06-22 NOTE — Patient Instructions (Signed)
Gastritis, Adult Gastritis is soreness and swelling (inflammation) of the lining of the stomach. Gastritis can develop as a sudden onset (acute) or long-term (chronic) condition. If gastritis is not treated, it can lead to stomach bleeding and ulcers. CAUSES  Gastritis occurs when the stomach lining is weak or damaged. Digestive juices from the stomach then inflame the weakened stomach lining. The stomach lining may be weak or damaged due to viral or bacterial infections. One common bacterial infection is the Helicobacter pylori infection. Gastritis can also result from excessive alcohol consumption, taking certain medicines, or having too much acid in the stomach.  SYMPTOMS  In some cases, there are no symptoms. When symptoms are present, they may include: Bacterial Vaginosis Bacterial vaginosis is a vaginal infection that occurs when the normal balance of bacteria in the vagina is disrupted. It results from an overgrowth of certain bacteria. This is the most common vaginal infection in women of childbearing age. Treatment is important to prevent complications, especially in pregnant women, as it can cause a premature delivery. CAUSES  Bacterial vaginosis is caused by an increase in harmful bacteria that are normally present in smaller amounts in the vagina. Several different kinds of bacteria can cause bacterial vaginosis. However, the reason that the condition develops is not fully understood. RISK FACTORS Certain activities or behaviors can put you at an increased risk of developing bacterial vaginosis, including: Having a new sex partner or multiple sex partners. Douching. Using an intrauterine device (IUD) for contraception. Women do not get bacterial vaginosis from toilet seats, bedding, swimming pools, or contact with objects around them. SIGNS AND SYMPTOMS  Some women with bacterial vaginosis have no signs or symptoms. Common symptoms include: Grey vaginal discharge. A fishlike odor with  discharge, especially after sexual intercourse. Itching or burning of the vagina and vulva. Burning or pain with urination. DIAGNOSIS  Your health care provider will take a medical history and examine the vagina for signs of bacterial vaginosis. A sample of vaginal fluid may be taken. Your health care provider will look at this sample under a microscope to check for bacteria and abnormal cells. A vaginal pH test may also be done.  TREATMENT  Bacterial vaginosis may be treated with antibiotic medicines. These may be given in the form of a pill or a vaginal cream. A second round of antibiotics may be prescribed if the condition comes back after treatment. Because bacterial vaginosis increases your risk for sexually transmitted diseases, getting treated can help reduce your risk for chlamydia, gonorrhea, HIV, and herpes. HOME CARE INSTRUCTIONS  Only take over-the-counter or prescription medicines as directed by your health care provider. If antibiotic medicine was prescribed, take it as directed. Make sure you finish it even if you start to feel better. Tell all sexual partners that you have a vaginal infection. They should see their health care provider and be treated if they have problems, such as a mild rash or itching. During treatment, it is important that you follow these instructions: Avoid sexual activity or use condoms correctly. Do not douche. Avoid alcohol as directed by your health care provider. Avoid breastfeeding as directed by your health care provider. SEEK MEDICAL CARE IF:  Your symptoms are not improving after 3 days of treatment. You have increased discharge or pain. You have a fever. MAKE SURE YOU:  Understand these instructions. Will watch your condition. Will get help right away if you are not doing well or get worse. [place] INFORMATION  Centers for Disease Control  and Prevention, Division of STD Prevention: [employer] American Sexual Health Association (ASHA):  www.ashastd.org    This information is not intended to replace advice given to you by your health care provider. Make sure you discuss any questions you have with your health care provider.   Document Released: 06/11/2005 Document Revised: 07/02/2014 Document Reviewed: 01/21/2013 Elsevier Interactive Patient Education 2016 Elsevier Inc. Nausea and Vomiting Nausea is a sick feeling that often comes before throwing up (vomiting). Vomiting is a reflex where stomach contents come out of your mouth. Vomiting can cause severe loss of body fluids (dehydration). Children and elderly adults can become dehydrated quickly, especially if they also have diarrhea. Nausea and vomiting are symptoms of a condition or disease. It is important to find the cause of your symptoms. CAUSES  Direct irritation of the stomach lining. This irritation can result from increased acid production (gastroesophageal reflux disease), infection, food poisoning, taking certain medicines (such as nonsteroidal anti-inflammatory drugs), alcohol use, or tobacco use. Signals from the brain.These signals could be caused by a headache, heat exposure, an inner ear disturbance, increased pressure in the brain from injury, infection, a tumor, or a concussion, pain, emotional stimulus, or metabolic problems. An obstruction in the gastrointestinal tract (bowel obstruction). Illnesses such as diabetes, hepatitis, gallbladder problems, appendicitis, kidney problems, cancer, sepsis, atypical symptoms of a heart attack, or eating disorders. Medical treatments such as chemotherapy and radiation. Receiving medicine that makes you sleep (general anesthetic) during surgery. DIAGNOSIS Your caregiver may ask for tests to be done if the problems do not improve after a few days. Tests may also be done if symptoms are severe or if the reason for the nausea and vomiting is not clear. Tests may include: Urine tests. Blood tests. Stool tests. Cultures (to  look for evidence of infection). X-rays or other imaging studies. Test results can help your caregiver make decisions about treatment or the need for additional tests. TREATMENT You need to stay well hydrated. Drink frequently but in small amounts.You may wish to drink water, sports drinks, clear broth, or eat frozen ice pops or gelatin dessert to help stay hydrated.When you eat, eating slowly may help prevent nausea.There are also some antinausea medicines that may help prevent nausea. HOME CARE INSTRUCTIONS  Take all medicine as directed by your caregiver. If you do not have an appetite, do not force yourself to eat. However, you must continue to drink fluids. If you have an appetite, eat a normal diet unless your caregiver tells you differently. Eat a variety of complex carbohydrates (rice, wheat, potatoes, bread), lean meats, yogurt, fruits, and vegetables. Avoid high-fat foods because they are more difficult to digest. Drink enough water and fluids to keep your urine clear or pale yellow. If you are dehydrated, ask your caregiver for specific rehydration instructions. Signs of dehydration may include: Severe thirst. Dry lips and mouth. Dizziness. Dark urine. Decreasing urine frequency and amount. Confusion. Rapid breathing or pulse. SEEK IMMEDIATE MEDICAL CARE IF:  You have blood or brown flecks (like coffee grounds) in your vomit. You have black or bloody stools. You have a severe headache or stiff neck. You are confused. You have severe abdominal pain. You have chest pain or trouble breathing. You do not urinate at least once every 8 hours. You develop cold or clammy skin. You continue to vomit for longer than 24 to 48 hours. You have a fever. MAKE SURE YOU:  Understand these instructions. Will watch your condition. Will get help right away if you are  not doing well or get worse.   This information is not intended to replace advice given to you by your health care  provider. Make sure you discuss any questions you have with your health care provider.   Document Released: 06/11/2005 Document Revised: 09/03/2011 Document Reviewed: 11/08/2010 Elsevier Interactive Patient Education 2016 [employer].   Vomiting.  An uncomfortable feeling of fullness after eating. DIAGNOSIS  Your caregiver may suspect you have gastritis based on your symptoms and a physical exam. To determine the cause of your gastritis, your caregiver may perform the following:  Blood or stool tests to check for the H pylori bacterium.  Gastroscopy. A thin, flexible tube (endoscope) is passed down the esophagus and into the stomach. The endoscope has a light and camera on the end. Your caregiver uses the endoscope to view the inside of the stomach.  Taking a tissue sample (biopsy) from the stomach to examine under a microscope. TREATMENT  Depending on the cause of your gastritis, medicines may be prescribed. If you have a bacterial infection, such as an H pylori infection, antibiotics may be given. If your gastritis is caused by too much acid in the stomach, H2 blockers or antacids may be given. Your caregiver may recommend that you stop taking aspirin, ibuprofen, or other nonsteroidal anti-inflammatory drugs (NSAIDs). HOME CARE INSTRUCTIONS  Only take over-the-counter or prescription medicines as directed by your caregiver.  If you were given antibiotic medicines, take them as directed. Finish them even if you start to feel better.  Drink enough fluids to keep your urine clear or pale yellow.  Avoid foods and drinks that make your symptoms worse, such as:  Caffeine or alcoholic drinks.  Chocolate.  Peppermint or mint flavorings.  Garlic and onions.  Spicy foods.  Citrus fruits, such as oranges, lemons, or limes.  Tomato-based foods such as sauce, chili, salsa, and pizza.  Fried and fatty foods.  Eat small, frequent meals instead of large meals. SEEK IMMEDIATE MEDICAL  CARE IF:   You have black or dark red stools.  You vomit blood or material that looks like coffee grounds.  You are unable to keep fluids down.  Your abdominal pain gets worse.  You have a fever.  You do not feel better after 1 week.  You have any other questions or concerns. MAKE SURE YOU:  Understand these instructions.  Will watch your condition.  Will get help right away if you are not doing well or get worse.   This information is not intended to replace advice given to you by your health care provider. Make sure you discuss any questions you have with your health care provider.   Document Released: 06/05/2001 Document Revised: 12/11/2011 Document Reviewed: 07/25/2011 Elsevier Interactive Patient Education [employer].

## 2015-07-01 ENCOUNTER — Encounter: Payer: Self-pay | Admitting: Family Medicine

## 2015-07-01 ENCOUNTER — Ambulatory Visit (INDEPENDENT_AMBULATORY_CARE_PROVIDER_SITE_OTHER): Payer: Medicaid Other | Admitting: Family Medicine

## 2015-07-01 VITALS — BP 148/74 | HR 97 | Temp 97.9°F | Ht 64.0 in | Wt 132.0 lb

## 2015-07-01 DIAGNOSIS — M79651 Pain in right thigh: Secondary | ICD-10-CM

## 2015-07-01 DIAGNOSIS — R1031 Right lower quadrant pain: Secondary | ICD-10-CM

## 2015-07-01 MED ORDER — CYCLOBENZAPRINE HCL 10 MG PO TABS: 10.0000 mg | ORAL_TABLET | Freq: Three times a day (TID) | ORAL | Status: AC | PRN

## 2015-07-01 MED ORDER — TRAMADOL HCL 50 MG PO TABS: 50.0000 mg | ORAL_TABLET | Freq: Four times a day (QID) | ORAL | Status: AC | PRN

## 2015-07-01 NOTE — Progress Notes (Signed)
Subjective:  Patient ID: [name], female    DOB: 1966-06-25  Age: 50 y.o. MRN: [id number]  CC: Abdominal Pain   HPI [name] presents for Abd pain onset Dec.26. Seen here on Dec. 28 and went to E.D. On 06/26/2015. Continues7/10 and is now down to 5/10. Intermittent. NO TRIGGERS. Some relief with sitting in tub of hot water. Pain is at the right hypogastric to suprapubic region. There is no nausea vomiting or diarrhea.  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   She has past surgical history that includes Breast lumpectomy; Coronary artery bypass graft (2006); Tubal ligation; and Tandem ring insertion (N/A, 04/06/2014).   Her family history includes Diabetes in her mother.She reports that she has been smoking Cigarettes.  She has a 35 pack-year smoking history. She has never used smokeless tobacco. She reports that she drinks alcohol. She reports that she does not use illicit drugs.  Outpatient Prescriptions Prior to Visit  Medication Sig Dispense Refill  . aspirin 81 MG tablet Take 81 mg by mouth daily.    . Multiple Vitamin (MULTIVITAMIN) tablet Take 1 tablet by mouth daily.    . metroNIDAZOLE (FLAGYL) 500 MG tablet Take 1 tablet (500 mg total) by mouth 2 (two) times daily. (Patient not taking: Reported on 07/01/2015) 14 tablet 0   No facility-administered medications prior to visit.    ROS Review of Systems  Constitutional: Negative for fever, activity change and appetite change.  HENT: Negative for congestion, rhinorrhea and sore throat.   Eyes: Negative for visual disturbance.  Respiratory: Negative for cough and shortness of breath.   Cardiovascular: Negative for chest pain and palpitations.  Gastrointestinal: Positive for  abdominal pain. Negative for nausea and diarrhea.  Genitourinary: Negative for dysuria.  Musculoskeletal: Negative for myalgias and arthralgias.    Objective:  BP 148/74 mmHg  Pulse 97  Temp(Src) 97.9 F (36.6 C) (Oral)  Ht '5\' 4"'$  (1.626 m)  Wt 132 lb (59.875 kg)  BMI 22.65 kg/m2  SpO2 97%  BP Readings from Last 3 Encounters:  07/01/15 148/74  06/22/15 137/80  12/30/14 148/87    Wt Readings from Last 3 Encounters:  07/01/15 132 lb (59.875 kg)  06/22/15 130 lb 6.4 oz (59.149 kg)  12/30/14 123 lb 6.4 oz (55.974 kg)     Physical Exam  Constitutional: She is oriented to person, place, and time. She appears well-developed and well-nourished. No distress.  HENT:  Head: Normocephalic and atraumatic.  Right Ear: External ear normal.  Left Ear: External ear normal.  Nose: Nose normal.  Mouth/Throat: Oropharynx is clear and moist.  Eyes: Conjunctivae and EOM are normal. Pupils are equal, round, and reactive to light.  Neck: Normal range of motion. Neck supple. No thyromegaly present.  Cardiovascular: Normal rate, regular rhythm and normal heart sounds.   No murmur heard. Pulmonary/Chest: Effort normal and breath sounds normal. No respiratory distress. She has no wheezes. She has no rales.  Abdominal: Soft. Bowel sounds are normal. She exhibits no distension. There is tenderness (noted at origof abductor tendon at the right suprapubic region/inguinal ligament.).  Lymphadenopathy:    She has no cervical adenopathy.  Neurological: She is alert and oriented to person, place, and time. She has normal reflexes.  Skin: Skin is warm and dry.  Psychiatric: She has a normal mood  and affect. Her behavior is normal. Judgment and thought content normal.     Lab Results  Component Value Date   WBC 6.5 07/01/2015   HGB CANCELED 08/06/2014   HCT 41.6 07/01/2015   PLT 299 07/01/2015   GLUCOSE 94 07/01/2015   CHOL 197 08/06/2014   TRIG 73 08/06/2014   HDL 44 08/06/2014   LDLCALC 50  12/12/2004   ALT 9 07/01/2015   AST 18 07/01/2015   NA 140 07/01/2015   K 4.4 07/01/2015   CL 97 07/01/2015   CREATININE 0.72 07/01/2015   BUN 8 07/01/2015   CO2 26 07/01/2015   INR 0.93 04/05/2014    [place] Intraoperative  04/06/2014  CLINICAL DATA: Cervical cancer, tandem ring placement Ultrasound was provided for use by the ordering physician, and a technical charge was applied by the performing facility.  No radiologist interpretation/professional services rendered.    Assessment & Plan:   [name] was seen today for abdominal pain.  Diagnoses and all orders for this visit:  Right lower quadrant abdominal pain -     CBC with Differential/Platelet -     CMP14+EGFR -     US Pelvis Complete; Future  Musculoskeletal pain of right thigh  Other orders -     traMADol (ULTRAM) 50 MG tablet; Take 1 tablet (50 mg total) by mouth 4 (four) times daily as needed for moderate pain. -     cyclobenzaprine (FLEXERIL) 10 MG tablet; Take 1 tablet (10 mg total) by mouth 3 (three) times daily as needed for muscle spasms.   I am having [name] start on traMADol and cyclobenzaprine. I am also having her maintain her aspirin, multivitamin, metroNIDAZOLE, and promethazine.  Meds ordered this encounter  Medications  . promethazine (PHENERGAN) 25 MG tablet    Sig: Take 25 mg by mouth every 6 (six) hours as needed for nausea or vomiting.  . traMADol (ULTRAM) 50 MG tablet    Sig: Take 1 tablet (50 mg total) by mouth 4 (four) times daily as needed for moderate pain.    Dispense:  60 tablet    Refill:  02  . cyclobenzaprine (FLEXERIL) 10 MG tablet    Sig: Take 1 tablet (10 mg total) by mouth 3 (three) times daily as needed for muscle spasms.    Dispense:  90 tablet    Refill:  1     Follow-up: Return if symptoms worsen or fail to improve.  [name], M.D.

## 2015-07-02 LAB — CMP14+EGFR
ALK PHOS: 117 IU/L (ref 39–117)
ALT: 9 IU/L (ref 0–32)
AST: 18 IU/L (ref 0–40)
Albumin/Globulin Ratio: 1.9 (ref 1.1–2.5)
Albumin: 4.8 g/dL (ref 3.5–5.5)
BUN/Creatinine Ratio: 11 (ref 9–23)
BUN: 8 mg/dL (ref 6–24)
Bilirubin Total: <0.2 mg/dL (ref 0.0–1.2)
CALCIUM: 10.2 mg/dL (ref 8.7–10.2)
CO2: 26 mmol/L (ref 18–29)
CREATININE: 0.72 mg/dL (ref 0.57–1.00)
Chloride: 97 mmol/L (ref 96–106)
GFR calc Af Amer: 112 mL/min/{1.73_m2} (ref >59)
GFR calc non Af Amer: 97 mL/min/{1.73_m2} (ref >59)
GLOBULIN, TOTAL: 2.5 g/dL (ref 1.5–4.5)
GLUCOSE: 94 mg/dL (ref 65–99)
Potassium: 4.4 mmol/L (ref 3.5–5.2)
Sodium: 140 mmol/L (ref 134–144)
Total Protein: 7.3 g/dL (ref 6.0–8.5)

## 2015-07-02 LAB — CBC WITH DIFFERENTIAL/PLATELET
Basophils Absolute: 0 10*3/uL (ref 0.0–0.2)
Basos: 0 %
EOS (ABSOLUTE): 0 10*3/uL (ref 0.0–0.4)
Eos: 1 %
HEMATOCRIT: 41.6 % (ref 34.0–46.6)
HEMOGLOBIN: 14.3 g/dL (ref 11.1–15.9)
IMMATURE GRANULOCYTES: 0 %
Immature Grans (Abs): 0 10*3/uL (ref 0.0–0.1)
Lymphocytes Absolute: 1.9 10*3/uL (ref 0.7–3.1)
Lymphs: 30 %
MCH: 32.2 pg (ref 26.6–33.0)
MCHC: 34.4 g/dL (ref 31.5–35.7)
MCV: 94 fL (ref 79–97)
MONOCYTES: 7 %
MONOS ABS: 0.4 10*3/uL (ref 0.1–0.9)
NEUTROS PCT: 62 %
Neutrophils Absolute: 4.1 10*3/uL (ref 1.4–7.0)
Platelets: 299 10*3/uL (ref 150–379)
RBC: 4.44 x10E6/uL (ref 3.77–5.28)
RDW: 12.6 % (ref 12.3–15.4)
WBC: 6.5 10*3/uL (ref 3.4–10.8)

## 2015-07-07 ENCOUNTER — Other Ambulatory Visit: Payer: Self-pay | Admitting: Family Medicine

## 2015-07-07 DIAGNOSIS — R1031 Right lower quadrant pain: Secondary | ICD-10-CM

## 2015-07-08 ENCOUNTER — Ambulatory Visit (HOSPITAL_COMMUNITY)
Admission: RE | Admit: 2015-07-08 | Discharge: 2015-07-08 | Disposition: A | Discharge status: Home / Self Care | Payer: Medicaid Other | Source: Ambulatory Visit | Attending: Family Medicine | Admitting: Family Medicine

## 2015-07-08 DIAGNOSIS — R1031 Right lower quadrant pain: Secondary | ICD-10-CM | POA: Insufficient documentation

## 2015-07-23 ENCOUNTER — Emergency Department (HOSPITAL_COMMUNITY)
Admission: EM | Admit: 2015-07-23 | Discharge: 2015-07-23 | Disposition: A | Discharge status: Home / Self Care | Payer: Medicaid Other | Attending: Emergency Medicine | Admitting: Emergency Medicine

## 2015-07-23 ENCOUNTER — Encounter (HOSPITAL_COMMUNITY): Payer: Self-pay | Admitting: *Deleted

## 2015-07-23 DIAGNOSIS — R1110 Vomiting, unspecified: Secondary | ICD-10-CM | POA: Insufficient documentation

## 2015-07-23 DIAGNOSIS — F17210 Nicotine dependence, cigarettes, uncomplicated: Secondary | ICD-10-CM | POA: Insufficient documentation

## 2015-07-23 DIAGNOSIS — Z8541 Personal history of malignant neoplasm of cervix uteri: Secondary | ICD-10-CM | POA: Insufficient documentation

## 2015-07-23 DIAGNOSIS — Z862 Personal history of diseases of the blood and blood-forming organs and certain disorders involving the immune mechanism: Secondary | ICD-10-CM | POA: Diagnosis not present

## 2015-07-23 DIAGNOSIS — I2510 Atherosclerotic heart disease of native coronary artery without angina pectoris: Secondary | ICD-10-CM | POA: Diagnosis not present

## 2015-07-23 DIAGNOSIS — Z7982 Long term (current) use of aspirin: Secondary | ICD-10-CM | POA: Diagnosis not present

## 2015-07-23 DIAGNOSIS — Z951 Presence of aortocoronary bypass graft: Secondary | ICD-10-CM | POA: Diagnosis not present

## 2015-07-23 DIAGNOSIS — R102 Pelvic and perineal pain: Secondary | ICD-10-CM

## 2015-07-23 DIAGNOSIS — Z79899 Other long term (current) drug therapy: Secondary | ICD-10-CM | POA: Diagnosis not present

## 2015-07-23 DIAGNOSIS — Z86018 Personal history of other benign neoplasm: Secondary | ICD-10-CM | POA: Diagnosis not present

## 2015-07-23 DIAGNOSIS — Z8639 Personal history of other endocrine, nutritional and metabolic disease: Secondary | ICD-10-CM | POA: Insufficient documentation

## 2015-07-23 DIAGNOSIS — Z9851 Tubal ligation status: Secondary | ICD-10-CM | POA: Diagnosis not present

## 2015-07-23 DIAGNOSIS — I252 Old myocardial infarction: Secondary | ICD-10-CM | POA: Diagnosis not present

## 2015-07-23 DIAGNOSIS — R1032 Left lower quadrant pain: Secondary | ICD-10-CM | POA: Diagnosis present

## 2015-07-23 DIAGNOSIS — Z8709 Personal history of other diseases of the respiratory system: Secondary | ICD-10-CM | POA: Insufficient documentation

## 2015-07-23 LAB — URINALYSIS, ROUTINE W REFLEX MICROSCOPIC
Glucose, UA: NEGATIVE mg/dL
Ketones, ur: 15 mg/dL — AB
LEUKOCYTES UA: NEGATIVE
Nitrite: NEGATIVE
PH: 5 (ref 5.0–8.0)
Protein, ur: NEGATIVE mg/dL
Specific Gravity, Urine: 1.028 (ref 1.005–1.030)

## 2015-07-23 LAB — URINE MICROSCOPIC-ADD ON

## 2015-07-23 LAB — COMPREHENSIVE METABOLIC PANEL
ALT: 10 U/L — AB (ref 14–54)
AST: 18 U/L (ref 15–41)
Albumin: 4.6 g/dL (ref 3.5–5.0)
Alkaline Phosphatase: 109 U/L (ref 38–126)
Anion gap: 11 (ref 5–15)
BUN: 11 mg/dL (ref 6–20)
CHLORIDE: 102 mmol/L (ref 101–111)
CO2: 27 mmol/L (ref 22–32)
CREATININE: 0.75 mg/dL (ref 0.44–1.00)
Calcium: 10.1 mg/dL (ref 8.9–10.3)
GFR calc Af Amer: >60 mL/min (ref >60)
GFR calc non Af Amer: >60 mL/min (ref >60)
Glucose, Bld: 137 mg/dL — ABNORMAL HIGH (ref 65–99)
Potassium: 4.1 mmol/L (ref 3.5–5.1)
SODIUM: 140 mmol/L (ref 135–145)
Total Bilirubin: 0.7 mg/dL (ref 0.3–1.2)
Total Protein: 7.7 g/dL (ref 6.5–8.1)

## 2015-07-23 LAB — WET PREP, GENITAL
SPERM: NONE SEEN
Trich, Wet Prep: NONE SEEN
YEAST WET PREP: NONE SEEN

## 2015-07-23 LAB — CBC
HEMATOCRIT: 40.8 % (ref 36.0–46.0)
HEMOGLOBIN: 13.9 g/dL (ref 12.0–15.0)
MCH: 31.8 pg (ref 26.0–34.0)
MCHC: 34.1 g/dL (ref 30.0–36.0)
MCV: 93.4 fL (ref 78.0–100.0)
Platelets: 151 10*3/uL (ref 150–400)
RBC: 4.37 MIL/uL (ref 3.87–5.11)
RDW: 12.3 % (ref 11.5–15.5)
WBC: 9.2 10*3/uL (ref 4.0–10.5)

## 2015-07-23 LAB — LIPASE, BLOOD: LIPASE: 19 U/L (ref 11–51)

## 2015-07-23 MED ORDER — HYDROCODONE-ACETAMINOPHEN 5-325 MG PO TABS
2.0000 | ORAL_TABLET | ORAL | Status: AC | PRN
Start: 2015-07-23 — End: ?

## 2015-07-23 NOTE — ED Notes (Signed)
The pt is c/o  Lower abd pain since chriostmas 2016   She has had numerus work ups with tests but no diagnosis has been made.  Worse since Thursday.  Vomiting.  Hx of cervical cancer.  lmp none

## 2015-07-23 NOTE — Discharge Instructions (Signed)

## 2015-07-23 NOTE — ED Provider Notes (Signed)
CSN: [id number]     Arrival date & time 07/23/15  0118 History  By signing my name below, I, [name], attest that this documentation has been prepared under the direction and in the presence of Orpah Greek, MD . Electronically Signed: Dora Gill, Scribe. 07/23/2015. 2:10 AM.    Chief Complaint  Patient presents with  . Abdominal Pain      The history is provided by the patient. No language interpreter was used.     HPI Comments: [name] is a 50 y.o. female with h/o cervical cancer and HLD who presents to the Emergency Department complaining of gradual onset, constant, worsening lower left abdominal pain beginning approximately one month ago. She states that she began feeling lightheaded earlier tonight and reports associated vomiting. Pt states that she went to see her PCP at onset of pain one month ago; they prescribed her antibiotics with no alleviation. Pt reports that she later had a CT Scan done in [place]. She had an ultrasound performed with her PCP around a month ago which she reports revealed thickening of the uterus. Pt denies consulting with her primary cancer doctor, Dr. Everitt Amber, since onset of pain. She denies ever having a hysterectomy. She denies pain exacerbation with deep palpation to abdomen. Pt is scheduled for a follow-up appointment with Dr. Everitt Amber on the 13th of February. Pt denies fever, chills, nausea, or any other associated symptoms at this time.    Past Medical History  Diagnosis Date  . Breast mass, right     Benign fibroid adenoma  . 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 Coronary atherosclerosis of native coronary artery     90% LAD ,100%RCA, LVEF 50-55%  . Hyperlipidemia   . Myocardial infarction ([place])     NSTEMI 5/06  . Cervical cancer ([facility])     squamous cell- stage IIB  . Anemia   . Upper respiratory infection with cough and congestion    Past Surgical History  Procedure Laterality Date  . Breast lumpectomy     . Coronary artery bypass graft  2006    Off pump LIMA to LAD, SVG to PDA  . Tubal ligation    . Tandem ring insertion N/A 04/06/2014    Procedure: exam under anesthesia, attempted TANDEM [place];  Surgeon: Blair Promise, MD;  [facility];  Service: Urology;  Laterality: N/A;   Family History  Problem Relation Age of Onset  . Diabetes Mother    Social History  Substance Use Topics  . Smoking status: Current Every Day Smoker -- 1.00 packs/day for 35 years    Types: Cigarettes  . Smokeless tobacco: Never Used  . Alcohol Use: Yes     Comment: occasional beer every now and then   OB History    No data available     Review of Systems  Constitutional: Negative for fever and chills.  Gastrointestinal: Positive for vomiting and abdominal pain (left lower abdominal area). Negative for nausea.  All other systems reviewed and are negative.     Allergies  Review of patient's allergies indicates no known allergies.  Home Medications   Prior to Admission medications   Medication Sig Start Date End Date Taking? Authorizing Provider  aspirin 81 MG tablet Take 81 mg by mouth daily.    Historical Provider, MD  cyclobenzaprine (FLEXERIL) 10 MG tablet Take 1 tablet (10 mg total) by mouth 3 (three) times daily as needed for muscle  spasms. 07/01/15   [name], MD  metroNIDAZOLE (FLAGYL) 500 MG tablet Take 1 tablet (500 mg total) by mouth 2 (two) times daily. Patient not taking: Reported on 07/01/2015 06/22/15   Lodema Pilot, PA-C  Multiple Vitamin (MULTIVITAMIN) tablet Take 1 tablet by mouth daily.    Historical Provider, MD  promethazine (PHENERGAN) 25 MG tablet Take 25 mg by mouth every 6 (six) hours as needed for nausea or vomiting.    Historical Provider, MD  traMADol (ULTRAM) 50 MG tablet Take 1 tablet (50 mg total) by mouth 4 (four) times daily as needed for moderate pain. 07/01/15   [name], MD   BP 146/90 mmHg  Pulse 74  Temp(Src) 98.4 F  (36.9 C)  Resp 22  Ht 5\' 4"  (1.626 m)  Wt 123 lb (55.792 kg)  BMI 21.10 kg/m2  SpO2 99% Physical Exam  Constitutional: She is oriented to person, place, and time. She appears well-developed and well-nourished. No distress.  HENT:  Head: Normocephalic and atraumatic.  Right Ear: Hearing normal.  Left Ear: Hearing normal.  Nose: Nose normal.  Mouth/Throat: Oropharynx is clear and moist and mucous membranes are normal.  Eyes: Conjunctivae and EOM are normal. Pupils are equal, round, and reactive to light.  Neck: Normal range of motion. Neck supple.  Cardiovascular: Regular rhythm, S1 normal and S2 normal.  Exam reveals no gallop and no friction rub.   No murmur heard. Pulmonary/Chest: Effort normal and breath sounds normal. No respiratory distress. She exhibits no tenderness.  Abdominal: Soft. Normal appearance and bowel sounds are normal. There is no hepatosplenomegaly. There is no tenderness. There is no rebound, no guarding, no tenderness at McBurney's point and negative Murphy's sign. No hernia.  Genitourinary:  No significant discharge noted on pelvic exam. Cervix grossly abnormal, likely radiation changes.  Musculoskeletal: Normal range of motion.  Neurological: She is alert and oriented to person, place, and time. She has normal strength. No cranial nerve deficit or sensory deficit. Coordination normal. GCS eye subscore is 4. GCS verbal subscore is 5. GCS motor subscore is 6.  Skin: Skin is warm, dry and intact. No rash noted. No cyanosis.  Psychiatric: She has a normal mood and affect. Her speech is normal and behavior is normal. Thought content normal.  Nursing note and vitals reviewed.   ED Course  Procedures (including critical care time)  DIAGNOSTIC STUDIES: Oxygen Saturation is 99% on RA, normal by my interpretation.    COORDINATION OF CARE:  2:11 AM Will order blood work. Discussed treatment plan with pt at bedside and pt agreed to plan.   Labs Review Labs  Reviewed  CBC  LIPASE, BLOOD  COMPREHENSIVE METABOLIC PANEL  URINALYSIS, ROUTINE W REFLEX MICROSCOPIC (NOT AT [facility])    Imaging Review No results found. I have personally reviewed and evaluated these images and lab results as part of my medical decision-making.   EKG Interpretation None      MDM   Final diagnoses:  None  Pelvic Pain  Resents to the emergency department with abdominal pain. Patient reports that she has been experiencing pelvic and lower abdominal pain since Christmas. She has been to multiple ERs. She reports having pelvic ultrasound as well as CT scan of abdomen and pelvis. She reports no acute abnormalities were noted. She does have a history of cervical cancer, treated with chemotherapy and radiation therapy. I have not identified any significant abnormalities on her workup today. Imaging is not necessary because of her multiple imaging studies performed  in the last couple of weeks. She is referred back to her diagnostic specialist, Dr. Denman George.  I personally performed the services described in this documentation, which was scribed in my presence. The recorded information has been reviewed and is accurate.      Orpah Greek, MD 07/23/15 [phone]

## 2015-07-25 LAB — GC/CHLAMYDIA PROBE AMP (~~LOC~~) NOT AT ARMC
CHLAMYDIA, DNA PROBE: NEGATIVE
NEISSERIA GONORRHEA: NEGATIVE

## 2015-08-01 ENCOUNTER — Encounter: Payer: Self-pay | Admitting: Gynecologic Oncology

## 2015-08-01 ENCOUNTER — Ambulatory Visit: Payer: Medicaid Other | Attending: Gynecologic Oncology | Admitting: Gynecologic Oncology

## 2015-08-01 VITALS — BP 123/75 | HR 106 | Temp 98.7°F | Resp 18 | Ht 64.0 in | Wt 118.7 lb

## 2015-08-01 DIAGNOSIS — E785 Hyperlipidemia, unspecified: Secondary | ICD-10-CM | POA: Insufficient documentation

## 2015-08-01 DIAGNOSIS — F17210 Nicotine dependence, cigarettes, uncomplicated: Secondary | ICD-10-CM | POA: Diagnosis not present

## 2015-08-01 DIAGNOSIS — D649 Anemia, unspecified: Secondary | ICD-10-CM | POA: Insufficient documentation

## 2015-08-01 DIAGNOSIS — N63 Unspecified lump in breast: Secondary | ICD-10-CM | POA: Diagnosis not present

## 2015-08-01 DIAGNOSIS — R109 Unspecified abdominal pain: Secondary | ICD-10-CM | POA: Diagnosis not present

## 2015-08-01 DIAGNOSIS — I2510 Atherosclerotic heart disease of native coronary artery without angina pectoris: Secondary | ICD-10-CM | POA: Insufficient documentation

## 2015-08-01 DIAGNOSIS — C539 Malignant neoplasm of cervix uteri, unspecified: Secondary | ICD-10-CM | POA: Insufficient documentation

## 2015-08-01 DIAGNOSIS — Z8541 Personal history of malignant neoplasm of cervix uteri: Secondary | ICD-10-CM | POA: Diagnosis not present

## 2015-08-01 DIAGNOSIS — Z951 Presence of aortocoronary bypass graft: Secondary | ICD-10-CM | POA: Diagnosis not present

## 2015-08-01 DIAGNOSIS — I252 Old myocardial infarction: Secondary | ICD-10-CM | POA: Insufficient documentation

## 2015-08-01 DIAGNOSIS — Z833 Family history of diabetes mellitus: Secondary | ICD-10-CM | POA: Insufficient documentation

## 2015-08-01 DIAGNOSIS — Z7289 Other problems related to lifestyle: Secondary | ICD-10-CM | POA: Insufficient documentation

## 2015-08-01 MED ORDER — OXYCODONE-ACETAMINOPHEN 5-325 MG PO TABS: 1.0000 | ORAL_TABLET | ORAL | Status: DC | PRN

## 2015-08-01 NOTE — Progress Notes (Signed)
Consult Note: Gyn-Onc   [name] 50 y.o. female with history of cervical cancer (stage IIB)  CC:  Chief Complaint  Patient presents with  . Cervical Cancer    MD follow up  abnormal surveillance pap (ASCUS, +HRHPV).  Assessment/Plan:  [name]  is a 50 y.o.  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 She completed primary chemoradiation in October 2015. She had an abnormal surveillance pap with Dr Sondra Come in June, 2016 (ASCUS with +HRHPV). She is otherwise NED on today's exam.  Cervical cancer: I discussed with the patient that abnormal Pap smears, particularly ASCUS, are extremely common after pelvic radiation. It is our societies guidelines do not to perform surveillance Pap smear screening following radiation for cervical cancer. If performed as they should be performed no more frequently than annually as part of screening for subsequent new onset HPV related disease. I recommend follow-up visit with Dr. Sondra Come in 3 months and with me 3 months thereafter.  Abdominal pain: Does not appear to be directly related to cervical cancer. Grossly normal CT imaging and [place]. I reviewed the images from the 07/29/15 CT scan. She has atherosclerosis and calcification of the aorta a branch vessels on imaging. Her pain is worse after eating.  It should be important to evaluate for GI ischemia as a potential etiology given her risk factors and the lack of structural abnormalities on imaging. I recommend referral to vascular surgery. Due to her [place] Access Medicaid status she will require referral faciliated by her PCP.  HPI: The patient is a 50 year old woman who was initially seen in August 2015 for clinical stage IIB moderately differentiated squamous cell carcinoma of the cervix. PET/CT was negative for distant metastatic disease. She underwent primary  chemoradiation with Dr. Sondra Come which was completed in October 2015. She was NED following completion of therapy. 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  Interval History: She denies vaginal bleeding or discharge. She is sexually active.  Since December 2016 she has had intermittent severe abdominal pains. These are worse after eating. She has some emesis after eating but continues to have flatus and occasional BM's. She denies blood in her stool or melena. She has discomfort with movement.  She was seen in the ED on January 13th, 2017 and a transvaginal US was performed which showed a normal uterus measuring 7.4 x 3.4 x 4.2 cm with a 7 mm endometrial thickness. The right and left ovaries were grossly normal and no free fluid was seen.  She had ongoing abdominal pain and return to the ER on [place] third 2017. CT scan of the abdomen and pelvis without contrast was performed. It showed no gross abnormalities in the pelvis or reproductive organs. This extensive atherosclerosis and calcification of the aortic branch vessels with small caliber vessels. This no ascites or pneumoperitoneum. There was no acute finding explanation for pain. Review of her labs from her visit confirmed a hemoglobin of 17 mg/dL. The white count was mildly elevated at 11,000.  Current Meds:  Outpatient Encounter Prescriptions as of 08/01/2015  Medication Sig  . cephALEXin (KEFLEX) 500 MG capsule TAKE ONE CAPSULE BY MOUTH 4 TIMES A DAY  . HYDROcodone-acetaminophen (NORCO/VICODIN) 5-325 MG tablet Take 2 tablets by mouth every 4 (four) hours as needed for moderate pain.  Marland Kitchen ondansetron (ZOFRAN) 4 MG tablet Take 4 mg by mouth every 6 (six)  hours as needed. for nausea  . promethazine (PHENERGAN) 25 MG tablet Take 25 mg by mouth every 6 (six) hours as needed for nausea or vomiting.  Marland Kitchen aspirin 81 MG tablet Take 81 mg by mouth daily. Reported on 08/01/2015  . cyclobenzaprine (FLEXERIL) 10 MG tablet Take 1 tablet (10 mg total) by  mouth 3 (three) times daily as needed for muscle spasms. (Patient not taking: Reported on 07/23/2015)  . metroNIDAZOLE (FLAGYL) 500 MG tablet Take 1 tablet (500 mg total) by mouth 2 (two) times daily. (Patient not taking: Reported on 07/01/2015)  . Multiple Vitamin (MULTIVITAMIN) tablet Take 1 tablet by mouth daily. Reported on 08/01/2015  . traMADol (ULTRAM) 50 MG tablet Take 1 tablet (50 mg total) by mouth 4 (four) times daily as needed for moderate pain. (Patient not taking: Reported on 08/01/2015)   No facility-administered encounter medications on file as of 08/01/2015.    Allergy: No Known Allergies  Social Hx:   Social History   Social History  . Marital Status: Single    Spouse Name: N/A  . Number of Children: 4  . Years of Education: N/A   Occupational History  . Not on file.   Social History Main Topics  . Smoking status: Current Every Day Smoker -- 1.00 packs/day for 35 years    Types: Cigarettes  . Smokeless tobacco: Never Used  . Alcohol Use: No     Comment: occasional beer every now and then  . Drug Use: No  . Sexual Activity: Not on file   Other Topics Concern  . Not on file   Social History Narrative    Past Surgical Hx:  Past Surgical History  Procedure Laterality Date  . Breast lumpectomy    . Coronary artery bypass graft  2006    Off pump LIMA to LAD, SVG to PDA  . Tubal ligation    . Tandem ring insertion N/A 04/06/2014    Procedure: exam under anesthesia, attempted TANDEM [place];  Surgeon: Blair Promise, MD;  Location: [facility];  Service: Urology;  Laterality: N/A;    Past Medical Hx:  Past Medical History  Diagnosis Date  . Breast mass, right     Benign fibroid adenoma  . 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 Coronary atherosclerosis of native coronary artery     90% LAD ,100%RCA, LVEF 50-55%  . Hyperlipidemia   . Myocardial infarction ([place])     NSTEMI 5/06  . Cervical cancer ([facility])     squamous cell- stage IIB   . Anemia   . Upper respiratory infection with cough and congestion     Past Gynecological History:  SVD x 4, scant gynecologic care (last pap 10-20 years ago). Hx of tubal ligation.  No LMP recorded. Patient is not currently having periods (Reason: Chemotherapy).  Family Hx:  Family History  Problem Relation Age of Onset  . Diabetes Mother     Review of Systems:  Constitutional  Feels fatigue which has been progressive, + weightloss  ENT Normal appearing ears and nares bilaterally Skin/Breast  No rash, sores, jaundice, itching, dryness Cardiovascular  No chest pain. + SOB on exertion. Can walk up a flight of stairs. Can lay flat. Pulmonary  No cough or wheeze.  Gastro Intestinal  No nausea, + vomitting, no diarrhoea. No bright red blood per rectum, + abdominal pain, change in bowel movement, or constipation.  Genito Urinary  No frequency, urgency, dysuria, see HPI. Musculo Skeletal  No myalgia, arthralgia, joint swelling or pain  Neurologic  No weakness, numbness, change in gait,  Psychology  No depression, anxiety, insomnia.   Vitals:  Blood pressure 123/75, pulse 106, temperature 98.7 F (37.1 C), temperature source Oral, resp. rate 18, height 5\' 4"  (1.626 m), weight 118 lb 11.2 oz (53.842 kg), SpO2 98 %.  Physical Exam: WD in NAD Neck  Supple NROM, without any enlargements.  Lymph Node Survey No cervical supraclavicular or inguinal adenopathy - shotty right inguinal nodes palpable. Cardiovascular  Pulse normal rate, regularity and rhythm. S1 and S2 normal.  Lungs  Clear to auscultation bilateraly, without wheezes/crackles/rhonchi. Good air movement.  Skin  No rash/lesions/breakdown  Psychiatry  Alert and oriented to person, place, and time  Abdomen  Normoactive bowel sounds, abdomen soft, minimally tender to deep palpation, and thin without evidence of hernia. Back No CVA tenderness Genito Urinary  Vulva/vagina: Normal external female genitalia.  No  lesions. No discharge and bleeding emanating from vagina.  Bladder/urethra:  No lesions or masses, well supported bladder  Vagina: Radiation changes, no lesions.  Cervix: Radiation changes, no lesions or abnormalities. Agglutination with vagina.  Uterus: Small, mobile, no parametrial involvement or nodularity.  Adnexa: No apparent masses. Rectal  No uterosacral lesions or nodularity. Extremities  No bilateral cyanosis, clubbing or edema.  Donaciano Eva, MD  08/01/2015, 3:44 PM

## 2015-08-01 NOTE — Patient Instructions (Signed)
You will receive a phone call about a referral to see the vascular surgeon to assess blood flow to your intestines.  Plan to get in with Shelah Lewandowsky as soon as possible.  Plan to follow up with Dr. Denman George three months after you see Dr. Sondra Come.  Please call [phone] to schedule after your see [place].  Please call for any questions or concerns.

## 2015-08-02 ENCOUNTER — Telehealth: Payer: Self-pay

## 2015-08-02 NOTE — Telephone Encounter (Signed)
Referral placed per Dr Terrence Dupont Rossi's orders , referral placed in [facility] for [facility] for possible bowel obstruction.

## 2015-08-08 ENCOUNTER — Ambulatory Visit: Payer: Medicaid Other | Admitting: Gynecologic Oncology

## 2015-08-10 ENCOUNTER — Other Ambulatory Visit: Payer: Self-pay | Admitting: Gynecologic Oncology

## 2015-08-10 DIAGNOSIS — C539 Malignant neoplasm of cervix uteri, unspecified: Secondary | ICD-10-CM

## 2015-08-10 DIAGNOSIS — R1084 Generalized abdominal pain: Secondary | ICD-10-CM

## 2015-08-10 DIAGNOSIS — R112 Nausea with vomiting, unspecified: Secondary | ICD-10-CM

## 2015-08-10 MED ORDER — PROMETHAZINE HCL 25 MG PO TABS: 25.0000 mg | ORAL_TABLET | Freq: Four times a day (QID) | ORAL | Status: AC | PRN

## 2015-08-10 MED ORDER — OXYCODONE-ACETAMINOPHEN 5-325 MG PO TABS: 1.0000 | ORAL_TABLET | ORAL | Status: AC | PRN

## 2015-08-10 NOTE — Progress Notes (Signed)
See RN Note.  Patient called stating she cannot get in to the Vascular Center until March 20.  Stating she is out of her pain medication and nausea medication.

## 2015-08-10 NOTE — Telephone Encounter (Signed)
Incoming call from patient's husband Tora Kindred states that his wife cant be seen at the [facility] until March 20 , 2017 . Richardson Landry states his wife is now out of pain medication and antiemetics . Melissa Cross ,APNP updated , Richardson Landry informed that a prescription has been approved for Percocet 5-325 MG PO 1-2 tablets every 4 hours PRN ( QTY : 40 ) as well as Phenergan 25 MG PO every 6 six hours PRN , ( QTY : 30 ) . Patient will have a family member come to pick up the prescription as the patient [place], Melissa cross ,APNP aware.

## 2015-08-10 NOTE — Telephone Encounter (Signed)
Incoming call from [facility]: [phone] inquiring if the referral that was placed to the [facility] and [facility] [phone]. Writer updated Richardson Landry that the referral was placed on Jul 28, 2015 as ordered by Dr Everitt Amber . Writer contacted the [facility] and [facility] and was transferred to the nurses line , no answer , left a detailed message with our request for a consultation . Faxed [phone] the progress note from last visit with Dr Everitt Amber on 08/01/2015 , referral orders , that were placed in Epic on 08/02/2015 and insurance card information. Transmission verification report received back "OK" .

## 2015-08-11 ENCOUNTER — Encounter: Payer: Medicaid Other | Admitting: Vascular Surgery

## 2015-08-11 ENCOUNTER — Encounter (HOSPITAL_COMMUNITY): Payer: Medicaid Other

## 2015-08-15 ENCOUNTER — Other Ambulatory Visit: Payer: Self-pay

## 2015-08-15 DIAGNOSIS — K551 Chronic vascular disorders of intestine: Secondary | ICD-10-CM

## 2015-08-16 ENCOUNTER — Telehealth: Payer: Self-pay

## 2015-08-16 NOTE — Telephone Encounter (Signed)
Call returned to the patient's friend [name] [facility]: [phone] , Hill states she is calling for [place] to inform us that [name] did not make it to her Vascular appointment yesterday 08/15/2015. Amber states that [name] is out of her "pain" medication and is requesting a refill . Amber was informed that the patient was aware of receiving a prescription for Percocet QTY: 40 , no refills to hold her until her appointment with the Vascular Clinic , Amber states understanding , denies further questions.

## 2015-09-05 ENCOUNTER — Encounter: Payer: Self-pay | Admitting: Vascular Surgery

## 2015-09-09 ENCOUNTER — Ambulatory Visit (INDEPENDENT_AMBULATORY_CARE_PROVIDER_SITE_OTHER): Payer: Medicaid Other | Admitting: Vascular Surgery

## 2015-09-09 ENCOUNTER — Encounter: Payer: Self-pay | Admitting: Vascular Surgery

## 2015-09-09 ENCOUNTER — Encounter (HOSPITAL_COMMUNITY): Payer: Medicaid Other

## 2015-09-09 VITALS — BP 133/80 | HR 109 | Temp 97.9°F | Ht 64.0 in | Wt 106.8 lb

## 2015-09-09 DIAGNOSIS — I7409 Other arterial embolism and thrombosis of abdominal aorta: Secondary | ICD-10-CM | POA: Diagnosis not present

## 2015-09-09 DIAGNOSIS — K551 Chronic vascular disorders of intestine: Secondary | ICD-10-CM

## 2015-09-09 NOTE — Addendum Note (Signed)
Addended by: Thresa Ross C on: 09/09/2015 11:28 AM   Modules accepted: Orders

## 2015-09-09 NOTE — Progress Notes (Signed)
Referred by:  [name], MD [place], [place] [id number]  Reason for referral: possible chronic mesenteric ischemia  History of Present Illness  [name] is a 50 y.o. (1966/05/02) female who presents with chief complaint: chronic abdominal pain.  The patient began having abdominal pain in December 2016.  The pain is mild to moderate in intensity with vague character, associated constipation.  The patient has lost reported 30 lb over the last 3 months.  She has no appetite and claims pain after eating.  She has some food fear also at this point.   The patient has already had: CABG for CAD.  Her atherosclerotic risk factors include: smoking, HLD.  She notes some non-compliance with her medical care.  Past Medical History  Diagnosis Date  . Breast mass, right     Benign fibroid adenoma  . 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 Coronary atherosclerosis of native coronary artery     90% LAD ,100%RCA, LVEF 50-55%  . Hyperlipidemia   . Myocardial infarction ([place])     NSTEMI 5/06  . Cervical cancer ([facility])     squamous cell- stage IIB  . Anemia   . Upper respiratory infection with cough and congestion     Past Surgical History  Procedure Laterality Date  . Breast lumpectomy    . Coronary artery bypass graft  2006    Off pump LIMA to LAD, SVG to PDA  . Tubal ligation    . Tandem ring insertion N/A 04/06/2014    Procedure: exam under anesthesia, attempted TANDEM [place];  Surgeon: Blair Promise, MD;  Location: [facility];  Service: Urology;  Laterality: N/A;    Social History   Social History  . Marital Status: Single    Spouse Name: N/A  . Number of Children: 4  . Years of Education: N/A   Occupational History  . Not on file.   Social History Main Topics  . Smoking status: Current Every Day Smoker -- 1.00 packs/day for 35 years    Types: Cigarettes  . Smokeless tobacco: Never Used  . Alcohol Use: No     Comment: occasional  beer every now and then  . Drug Use: No  . Sexual Activity: Not on file   Other Topics Concern  . Not on file   Social History Narrative    Family History  Problem Relation Age of Onset  . Diabetes Mother     Current Outpatient Prescriptions  Medication Sig Dispense Refill  . aspirin 81 MG tablet Take 81 mg by mouth daily. Reported on 08/01/2015    . cephALEXin (KEFLEX) 500 MG capsule TAKE ONE CAPSULE BY MOUTH 4 TIMES A DAY  0  . cyclobenzaprine (FLEXERIL) 10 MG tablet Take 1 tablet (10 mg total) by mouth 3 (three) times daily as needed for muscle spasms. (Patient not taking: Reported on 07/23/2015) 90 tablet 1  . HYDROcodone-acetaminophen (NORCO/VICODIN) 5-325 MG tablet Take 2 tablets by mouth every 4 (four) hours as needed for moderate pain. 20 tablet 0  . metroNIDAZOLE (FLAGYL) 500 MG tablet Take 1 tablet (500 mg total) by mouth 2 (two) times daily. (Patient not taking: Reported on 07/01/2015) 14 tablet 0  . Multiple Vitamin (MULTIVITAMIN) tablet Take 1 tablet by mouth daily. Reported on 08/01/2015    . ondansetron (ZOFRAN) 4 MG tablet Take 4 mg by mouth every 6 (six) hours as needed. for nausea  0  . oxyCODONE-acetaminophen (  PERCOCET) 5-325 MG tablet Take 1-2 tablets by mouth every 4 (four) hours as needed for severe pain. 40 tablet 0  . promethazine (PHENERGAN) 25 MG tablet Take 1 tablet (25 mg total) by mouth every 6 (six) hours as needed for nausea or vomiting. 30 tablet 0  . traMADol (ULTRAM) 50 MG tablet Take 1 tablet (50 mg total) by mouth 4 (four) times daily as needed for moderate pain. (Patient not taking: Reported on 08/01/2015) 60 tablet 02   No current facility-administered medications for this visit.     No Known Allergies   REVIEW OF SYSTEMS:  (Positives checked otherwise negative)  CARDIOVASCULAR:   [ ]  chest pain,  [ ]  chest pressure,  [ ]  palpitations,  [ ]  shortness of breath when laying flat,  [ ]  shortness of breath with exertion,   [ ]  pain in feet when  walking,  [ ]  pain in feet when laying flat, [ ]  history of blood clot in veins (DVT),  [ ]  history of phlebitis,  [ ]  swelling in legs,  [ ]  varicose veins  PULMONARY:   [ ]  productive cough,  [ ]  asthma,  [ ]  wheezing  NEUROLOGIC:   [ ]  weakness in arms or legs,  [ ]  numbness in arms or legs,  [ ]  difficulty speaking or slurred speech,  [ ]  temporary loss of vision in one eye,  [ ]  dizziness  HEMATOLOGIC:   [ ]  bleeding problems,  [ ]  problems with blood clotting too easily  MUSCULOSKEL:   [ ]  joint pain, [ ]  joint swelling  GASTROINTEST:   [ ]  vomiting blood,  [ ]  blood in stool     GENITOURINARY:   [ ]  burning with urination,  [ ]  blood in urine  PSYCHIATRIC:   [ ]  history of major depression  INTEGUMENTARY:   [ ]  rashes,  [ ]  ulcers  CONSTITUTIONAL:   [ ]  fever,  [ ]  chills   For VQI Use Only  PRE-ADM LIVING: Home  AMB STATUS: Ambulatory  CAD Sx: History of MI, but no symptoms No MI within 6 months  PRIOR CHF: None  STRESS TEST: [x ] No, [ ]  Normal, [ ]  + ischemia, [ ]  + MI, [ ]  Both   Physical Examination  Filed Vitals:   09/09/15 0903  BP: 133/80  Pulse: 109  Temp: 97.9 F (36.6 C)  TempSrc: Oral  Height: 5\' 4"  (1.626 m)  Weight: 106 lb 12.8 oz (48.444 kg)  SpO2: 99%   Body mass index is 18.32 kg/(m^2).  General: A&O x 3, WD, Cachectic,   Head: [place]/AT  Ear/Nose/Throat: Hearing grossly intact, nares w/o erythema or drainage, oropharynx w/o Erythema/Exudate, Mallampati score: 2  Eyes: PERRLA, EOMI  Neck: Supple, no nuchal rigidity, no palpable LAD  Pulmonary: Sym exp, good air movt, CTAB, no rales, rhonchi, & wheezing  Cardiac: RRR, Nl S1, S2, no Murmurs, rubs or gallops  Vascular: Vessel Right Left  Radial Palpable Palpable  Brachial Palpable Palpable  Carotid Palpable, without bruit Palpable, without bruit  Aorta Not palpable N/A  Femoral Palpable Palpable  Popliteal Not palpable Not palpable  PT Not Palpable Not  Palpable  DP Not Palpable Not Palpable   Gastrointestinal: soft, diffuse TTP, no G/R, no HSM, no masses, no CVAT B, redundant skin  Musculoskeletal: M/S 5/5 throughout , Extremities without ischemic changes   Neurologic: CN 2-12 intact , Pain and light touch intact in extremities , Motor exam as listed above  Psychiatric: Judgment intact, Mood & affect appropriate for pt's clinical situation  Dermatologic: See M/S exam for extremity exam, no rashes otherwise noted  Lymph : No Cervical, Axillary, or Inguinal lymphadenopathy    Outside CT abd/pelvis w/ contrast (06/26/15)  New asymmetry and thickening of the soft tissues of the right side of the body of the uterus.  This could be secondary to radiation therapy for cervical cancer but the appearance is nonspecific.  Small amount of nonspecific free fluid in the pelvis.  Extensive aortoliliac atherosclerosis  CT abd/pelvis w/o contrast (07/29/15) No obvious etiology for abd pain  Based on my review of this patient's CT, aorta and iliac arteries cannot be evaluated completely given lack of IV contrast.   Outside Studies/Documentation 10 pages of outside documents were reviewed including: GYN ONC.   Medical Decision Making  VEDIA PERK is a 50 y.o. female who presents with: possible chronic mesenteric ischemia, wide spread atherosclerosis.   I would start with CTA abd/pelvis to evaluate the mesenteric arteries and aorta and iliac.  Depending on those findings, dedicated mesenteric angiography with stenting may be needed.  If 2 out 3 mesenteric arteries are patent, however, she will need GI consult for further evaluation.  I discussed in depth with the patient the nature of atherosclerosis, and emphasized the importance of maximal medical management including strict control of blood pressure, blood glucose, and lipid levels, antiplatelet agents, obtaining regular exercise, and cessation of smoking.    The patient is aware that  without maximal medical management the underlying atherosclerotic disease process will progress, limiting the benefit of any interventions. The patient is currently not on a statin.  Will discuss starting one at her next appointment. The patient is currently on an anti-platelet: ASA.  Thank you for allowing [place] to participate in this patient's care.   Adele Barthel, MD [facility] Office: [phone] Pager: [phone]  09/09/2015, 8:26 AM

## 2015-09-12 ENCOUNTER — Encounter (HOSPITAL_COMMUNITY): Payer: Medicaid Other

## 2015-09-12 ENCOUNTER — Encounter: Payer: Medicaid Other | Admitting: Surgery

## 2015-09-15 ENCOUNTER — Encounter: Payer: Self-pay | Admitting: Vascular Surgery

## 2015-09-22 ENCOUNTER — Inpatient Hospital Stay (HOSPITAL_COMMUNITY)
Admission: EM | Admit: 2015-09-22 | Discharge: 2015-10-24 | DRG: 246 | Disposition: E | Payer: Medicaid Other | Attending: Cardiovascular Disease | Admitting: Cardiovascular Disease

## 2015-09-22 ENCOUNTER — Encounter (HOSPITAL_COMMUNITY): Admission: EM | Disposition: E | Payer: Self-pay | Source: Home / Self Care | Attending: Cardiovascular Disease

## 2015-09-22 ENCOUNTER — Encounter (HOSPITAL_COMMUNITY): Payer: Self-pay | Admitting: Emergency Medicine

## 2015-09-22 ENCOUNTER — Other Ambulatory Visit: Payer: Self-pay

## 2015-09-22 DIAGNOSIS — I469 Cardiac arrest, cause unspecified: Secondary | ICD-10-CM | POA: Diagnosis not present

## 2015-09-22 DIAGNOSIS — I2119 ST elevation (STEMI) myocardial infarction involving other coronary artery of inferior wall: Secondary | ICD-10-CM | POA: Diagnosis not present

## 2015-09-22 DIAGNOSIS — R570 Cardiogenic shock: Secondary | ICD-10-CM | POA: Diagnosis present

## 2015-09-22 DIAGNOSIS — L8990 Pressure ulcer of unspecified site, unspecified stage: Secondary | ICD-10-CM | POA: Insufficient documentation

## 2015-09-22 DIAGNOSIS — I2510 Atherosclerotic heart disease of native coronary artery without angina pectoris: Secondary | ICD-10-CM | POA: Diagnosis present

## 2015-09-22 DIAGNOSIS — N179 Acute kidney failure, unspecified: Secondary | ICD-10-CM | POA: Diagnosis present

## 2015-09-22 DIAGNOSIS — T82898A Other specified complication of vascular prosthetic devices, implants and grafts, initial encounter: Principal | ICD-10-CM | POA: Diagnosis present

## 2015-09-22 DIAGNOSIS — I2111 ST elevation (STEMI) myocardial infarction involving right coronary artery: Secondary | ICD-10-CM

## 2015-09-22 DIAGNOSIS — I480 Paroxysmal atrial fibrillation: Secondary | ICD-10-CM | POA: Diagnosis present

## 2015-09-22 DIAGNOSIS — E785 Hyperlipidemia, unspecified: Secondary | ICD-10-CM | POA: Diagnosis present

## 2015-09-22 DIAGNOSIS — G9341 Metabolic encephalopathy: Secondary | ICD-10-CM | POA: Diagnosis present

## 2015-09-22 DIAGNOSIS — G931 Anoxic brain damage, not elsewhere classified: Secondary | ICD-10-CM | POA: Diagnosis present

## 2015-09-22 DIAGNOSIS — K7200 Acute and subacute hepatic failure without coma: Secondary | ICD-10-CM | POA: Diagnosis present

## 2015-09-22 DIAGNOSIS — I252 Old myocardial infarction: Secondary | ICD-10-CM

## 2015-09-22 DIAGNOSIS — E872 Acidosis: Secondary | ICD-10-CM | POA: Diagnosis present

## 2015-09-22 DIAGNOSIS — R069 Unspecified abnormalities of breathing: Secondary | ICD-10-CM

## 2015-09-22 DIAGNOSIS — J9601 Acute respiratory failure with hypoxia: Secondary | ICD-10-CM | POA: Insufficient documentation

## 2015-09-22 DIAGNOSIS — R739 Hyperglycemia, unspecified: Secondary | ICD-10-CM | POA: Diagnosis present

## 2015-09-22 DIAGNOSIS — Z9289 Personal history of other medical treatment: Secondary | ICD-10-CM

## 2015-09-22 DIAGNOSIS — I241 Dressler's syndrome: Secondary | ICD-10-CM | POA: Diagnosis present

## 2015-09-22 DIAGNOSIS — D649 Anemia, unspecified: Secondary | ICD-10-CM | POA: Diagnosis present

## 2015-09-22 DIAGNOSIS — Z952 Presence of prosthetic heart valve: Secondary | ICD-10-CM

## 2015-09-22 DIAGNOSIS — Y832 Surgical operation with anastomosis, bypass or graft as the cause of abnormal reaction of the patient, or of later complication, without mention of misadventure at the time of the procedure: Secondary | ICD-10-CM | POA: Diagnosis present

## 2015-09-22 DIAGNOSIS — R0902 Hypoxemia: Secondary | ICD-10-CM | POA: Insufficient documentation

## 2015-09-22 DIAGNOSIS — I472 Ventricular tachycardia: Secondary | ICD-10-CM | POA: Diagnosis not present

## 2015-09-22 DIAGNOSIS — Z452 Encounter for adjustment and management of vascular access device: Secondary | ICD-10-CM | POA: Insufficient documentation

## 2015-09-22 DIAGNOSIS — K559 Vascular disorder of intestine, unspecified: Secondary | ICD-10-CM | POA: Diagnosis present

## 2015-09-22 DIAGNOSIS — Z923 Personal history of irradiation: Secondary | ICD-10-CM

## 2015-09-22 DIAGNOSIS — Z8541 Personal history of malignant neoplasm of cervix uteri: Secondary | ICD-10-CM

## 2015-09-22 DIAGNOSIS — E876 Hypokalemia: Secondary | ICD-10-CM | POA: Insufficient documentation

## 2015-09-22 DIAGNOSIS — Z9221 Personal history of antineoplastic chemotherapy: Secondary | ICD-10-CM

## 2015-09-22 HISTORY — DX: Cardiac arrest, cause unspecified: I46.9

## 2015-09-22 HISTORY — DX: ST elevation (STEMI) myocardial infarction involving other coronary artery of inferior wall: I21.19

## 2015-09-22 HISTORY — DX: Malignant (primary) neoplasm, unspecified: C80.1

## 2015-09-22 HISTORY — PX: CARDIAC CATHETERIZATION: SHX172

## 2015-09-22 HISTORY — DX: Atherosclerotic heart disease of native coronary artery without angina pectoris: I25.10

## 2015-09-22 LAB — I-STAT TROPONIN, ED: TROPONIN I, POC: 1.28 ng/mL — AB (ref 0.00–0.08)

## 2015-09-22 LAB — I-STAT CG4 LACTIC ACID, ED: LACTIC ACID, VENOUS: 12.14 mmol/L — AB (ref 0.5–2.0)

## 2015-09-22 LAB — I-STAT CHEM 8, ED
BUN: 12 mg/dL (ref 6–20)
Calcium, Ion: 0.98 mmol/L — ABNORMAL LOW (ref 1.12–1.23)
Chloride: 98 mmol/L — ABNORMAL LOW (ref 101–111)
Creatinine, Ser: 0.8 mg/dL (ref 0.44–1.00)
GLUCOSE: 245 mg/dL — AB (ref 65–99)
HEMATOCRIT: 33 % — AB (ref 36.0–46.0)
HEMOGLOBIN: 11.2 g/dL — AB (ref 12.0–15.0)
POTASSIUM: 3.3 mmol/L — AB (ref 3.5–5.1)
Sodium: 137 mmol/L (ref 135–145)
TCO2: 20 mmol/L (ref 0–100)

## 2015-09-22 SURGERY — LEFT HEART CATH AND CORS/GRAFTS ANGIOGRAPHY
Anesthesia: LOCAL

## 2015-09-22 SURGERY — LEFT HEART CATH AND CORONARY ANGIOGRAPHY
Anesthesia: LOCAL

## 2015-09-22 MED ORDER — SUCCINYLCHOLINE CHLORIDE 20 MG/ML IJ SOLN
INTRAMUSCULAR | Status: AC | PRN
  Administered 2015-09-22: 120 mg via INTRAVENOUS

## 2015-09-22 MED ORDER — LIDOCAINE HCL (PF) 1 % IJ SOLN
INTRAMUSCULAR | Status: AC
  Filled 2015-09-22: qty 30

## 2015-09-22 MED ORDER — HEPARIN (PORCINE) IN NACL 2-0.9 UNIT/ML-% IJ SOLN
INTRAMUSCULAR | Status: AC
  Filled 2015-09-22: qty 1000

## 2015-09-22 MED ORDER — NOREPINEPHRINE BITARTRATE 1 MG/ML IV SOLN
INTRAVENOUS | Status: AC
Start: 2015-09-22 — End: 2015-09-22
  Filled 2015-09-22: qty 4

## 2015-09-22 MED ORDER — NITROGLYCERIN 1 MG/10 ML FOR IR/CATH LAB
INTRA_ARTERIAL | Status: AC
Start: 2015-09-22 — End: 2015-09-22
  Filled 2015-09-22: qty 10

## 2015-09-22 MED ORDER — PROPOFOL 1000 MG/100ML IV EMUL
INTRAVENOUS | Status: AC
  Filled 2015-09-22: qty 100

## 2015-09-22 SURGICAL SUPPLY — 17 items
BALLN EMERGE MR 2.5X20 (BALLOONS) ×2
BALLOON EMERGE MR 2.5X20 (BALLOONS) ×1 IMPLANT
CATH INFINITI 5FR MULTPACK ANG (CATHETERS) ×1 IMPLANT
DEVICE SPIDERFX EMB PROT 5MM (WIRE) ×1 IMPLANT
GLIDESHEATH SLEND SS 6F .021 (SHEATH) ×1 IMPLANT
GUIDE CATH RUNWAY 6FR RCB (CATHETERS) ×1 IMPLANT
KIT ENCORE 26 ADVANTAGE (KITS) ×2 IMPLANT
KIT HEART LEFT (KITS) ×2 IMPLANT
PACK CARDIAC CATHETERIZATION (CUSTOM PROCEDURE TRAY) ×2 IMPLANT
SHEATH PINNACLE 6F 10CM (SHEATH) ×1 IMPLANT
STENT PROMUS PREM MR 3.5X28 (Permanent Stent) ×1 IMPLANT
SYR MEDRAD MARK V 150ML (SYRINGE) ×2 IMPLANT
TRANSDUCER W/STOPCOCK (MISCELLANEOUS) ×2 IMPLANT
TUBING CIL FLEX 10 FLL-RA (TUBING) ×2 IMPLANT
WIRE COUGAR XT STRL 190CM (WIRE) ×1 IMPLANT
WIRE HI TORQ VERSACORE-J 145CM (WIRE) ×1 IMPLANT
WIRE SAFE-T 1.5MM-J .035X260CM (WIRE) ×1 IMPLANT

## 2015-09-22 NOTE — ED Provider Notes (Signed)
CSN: [id number]     Arrival date & time 08/28/2015  2256 History   None    Chief Complaint  Patient presents with  . Code STEMI    HPI 50 y.o. female presenting status post CPR. Patent patient had a witnessed cardiac arrest at home and was down for about 4 minutes prior to EMS arrival. EMS started CPR on their arrival at 2147. Initial rhythm was V. fib. 5 epinephrine doses given as well as 4 shocks at which point the patient regained spontaneous circulation. She has a history of CABG in the past as well as cervical cancer. King airway placed in the field for airway as well as 2 IOs for access.    Past Medical History  Diagnosis Date  . Cancer ([place])     cervical  . Cardiac arrest ([place]) 08/31/2015  . Acute MI, inferoposterior wall, initial episode of care ([place]) 09/21/2015  . CAD (coronary artery disease), native coronary artery     NSTEMI 2006, treated with CABG   Past Surgical History  Procedure Laterality Date  . Aortic valve replacement (avr)/coronary artery bypass grafting (cabg)      LIMA-LAD, SVG-RCA   No family history on file. Social History  Substance Use Topics  . Smoking status: Not on file  . Smokeless tobacco: Not on file  . Alcohol Use: Not on file   OB History    No data available     Review of Systems  Unable to perform ROS: Patient unresponsive      Allergies  Review of patient's allergies indicates not on file.  Home Medications   Prior to Admission medications   Not on File   BP 112/86 mmHg  Pulse 0  Temp(Src) 96 F (35.6 C) (Temporal)  Resp 8  Ht 5\' 6"  (1.676 m)  Wt 50 kg  BMI 17.80 kg/m2  SpO2 0%  LMP  Physical Exam  Constitutional:  unresponsive  HENT:  Head: Normocephalic and atraumatic.  King airway in place  Eyes: Pupils are equal, round, and reactive to light. Right eye exhibits no discharge. Left eye exhibits no discharge.  Neck: No tracheal deviation present.  Cardiovascular: Regular rhythm and intact distal pulses.  Exam  reveals no gallop and no friction rub.   No murmur heard. Tachycardic to 110 bpm  Pulmonary/Chest:  King airway in place  Abdominal: Soft. She exhibits no distension.  Musculoskeletal:  Grossly atraumatic  Neurological:  Unresponsive, no purposeful movements  Skin: Skin is warm and dry.    ED Course  .Central Line Date/Time: 09/23/2015 2:15 AM Performed by: Zipporah Plants Authorized by: Pattricia Boss Consent: The procedure was performed in an emergent situation. Indications: vascular access Preparation: skin prepped with 2% chlorhexidine Skin prep agent dried: skin prep agent completely dried prior to procedure Sterile barriers: all five maximum sterile barriers used - cap, mask, sterile gown, sterile gloves, and large sterile sheet Location details: right femoral Patient position: flat Catheter type: triple lumen Pre-procedure: landmarks identified Ultrasound guidance: no Number of attempts: 1 Successful placement: yes Post-procedure: line sutured and dressing applied Assessment: blood return through all ports and free fluid flow Patient tolerance: Patient tolerated the procedure well with no immediate complications  .Intubation Date/Time: 09/23/2015 2:21 AM Performed by: Pattricia Boss Authorized by: Pattricia Boss Consent: The procedure was performed in an emergent situation. Indications: respiratory failure Intubation method: video-assisted Patient status: paralyzed (RSI) Preoxygenation: BVM Sedatives: propofol Paralytic: succinylcholine Laryngoscope size: Mac 3 Tube size: 8.0 mm Tube type: cuffed  Number of attempts: 1 Post-procedure assessment: chest rise and ETCO2 monitor Breath sounds: equal and absent over the epigastrium Cuff inflated: yes ETT to lip: 24 cm Tube secured with: ETT holder Chest x-ray interpreted by radiologist. Chest x-ray findings: endotracheal tube in appropriate position Patient tolerance: Patient tolerated the procedure well with  no immediate complications   Labs Review Labs Reviewed  I-STAT CHEM 8, ED - Abnormal; Notable for the following:    Potassium 3.3 (*)    Chloride 98 (*)    Glucose, Bld 245 (*)    Calcium, Ion 0.98 (*)    Hemoglobin 11.2 (*)    HCT 33.0 (*)    All other components within normal limits  I-STAT CG4 LACTIC ACID, ED - Abnormal; Notable for the following:    Lactic Acid, Venous 12.14 (*)    All other components within normal limits  I-STAT TROPOININ, ED - Abnormal; Notable for the following:    Troponin i, poc 1.28 (*)    All other components within normal limits  TROPONIN I  TROPONIN I  TROPONIN I  TROPONIN I  BASIC METABOLIC PANEL  BASIC METABOLIC PANEL  BASIC METABOLIC PANEL  BASIC METABOLIC PANEL  BASIC METABOLIC PANEL  PROTIME-INR  PROTIME-INR  APTT  APTT  BLOOD GAS, ARTERIAL  BLOOD GAS, ARTERIAL  CBC  MAGNESIUM  PHOSPHORUS  CBC  TSH  TROPONIN I  TROPONIN I  TROPONIN I  HEMOGLOBIN A1C  BRAIN NATRIURETIC PEPTIDE    Imaging Review No results found. I have personally reviewed and evaluated these images and lab results as part of my medical decision-making.   EKG Interpretation None      MDM   Final diagnoses:  Encounter for central line placement  STEMI  Patient is unresponsive but in normal sinus rhythm on arrival. Intubated according to the procedure note above. Right femoral triple lumen placed for access. EKG reveals STEMI. Cardiology at bedside on patient arrival. Lactic acidosis to 12 and troponin of 1.28. Patient brought immediately to the Cath Lab for revascularization.     Lavelle Berland Algernon Huxley, MD 09/23/15 [id number]  Pattricia Boss, MD 10/10/2015 1740

## 2015-09-22 NOTE — ED Notes (Signed)
Pt brought to ED by EMS from home after having a witness cardiac arrest, pt state pt was down around 4 min prior to first responder started CPR. CPR began at 2147, 5 epi given by EMS, 1 Narcan pt shock 4 times before getting pulses back. Pt had long cardiac hx with a CABG on the past. Hx of cervical CA.

## 2015-09-22 NOTE — Code Documentation (Signed)
Pt transferred to cath lab.

## 2015-09-22 NOTE — Consult Note (Signed)
History and Physical  Patient ID: [name] MRN: [id number], SOB: 1966/05/04 50 y.o. Date of Encounter: 08/24/2015, 11:52 PM  Primary Physician: Chevis Pretty, FNP Primary Cardiologist: none  Chief Complaint: Out-of-hospital arrest  HPI: 50 y.o. female w/ PMHx significant for CAD s/p CABG who presented to [facility] on 09/19/2015 after witnessed cardiac arrest. She apparently complained of weakness and shortness of breath, then became unresponsive. CPR was done. On EMS arrival the patient was defibrillated x 4 and received 5 mg epinephrine. She has been unconscious since her collapse and is unable to provide any history. There are no family at bedside. Hx obtained from EMS personnel. EKG shows massive inferoposterior STEMI and a Code Stemi was called from the field.   Past Medical History  Diagnosis Date  . Cancer ([place])     cervical  . Cardiac arrest ([place]) 09/06/2015  . Acute MI, inferoposterior wall, initial episode of care ([place]) 09/18/2015  . CAD (coronary artery disease), native coronary artery     NSTEMI 2006, treated with CABG     Surgical History:  Past Surgical History  Procedure Laterality Date  . Aortic valve replacement (avr)/coronary artery bypass grafting (cabg)      LIMA-LAD, SVG-RCA     Home Meds: Prior to Admission medications   Not on File    Allergies: Allergies not on file  Social History   Social History  . Marital Status: Single    Spouse Name: N/A  . Number of Children: N/A  . Years of Education: N/A   Occupational History  . Not on file.   Social History Main Topics  . Smoking status: Not on file  . Smokeless tobacco: Not on file  . Alcohol Use: Not on file  . Drug Use: Not on file  . Sexual Activity: Not on file   Other Topics Concern  . Not on file   Social History Narrative  . No narrative on file     No family history on file. Family hx not obtainable as patient unresponsive.   Review of Systems: unable  to obtain  Physical Exam: Blood pressure 148/128, pulse 118, temperature 96 F (35.6 C), temperature source Temporal, resp. rate 27, height 5\' 6"  (1.676 m), weight 110 lb 3.7 oz (50 kg), SpO2 100 %. General: Frail, chronically ill-appearing woman, unconscious HEENT: Normocephalic, atraumatic, sclera anicteric Neck: Carotids faint. JVP elevated Lungs: coarse bilaterally to auscultation without wheezes, rales, or rhonchi.  Heart: RRR with normal S1 and S2. No murmurs, rubs, or gallops appreciated. Abdomen: Soft, thin, non-distended with normoactive bowel sounds. No hepatomegaly. No obvious abdominal masses. Back: midline Msk:  No deformity Extremities: No clubbing, cyanosis, or edema.  Distal pedal pulses are nonpalpable, femorals are nonpalp right and faint on left Neuro: unconscious/unresponsive Psych: unable to assess Skin: warm and dry without rash   Labs:   Lab Results  Component Value Date   HGB 11.2* 08/29/2015   HCT 33.0* 08/29/2015     Recent Labs Lab 09/16/2015 2312  NA 137  K 3.3*  CL 98*  BUN 12  CREATININE 0.80  GLUCOSE 245*   No results for input(s): CKTOTAL, CKMB, TROPONINI in the last 72 hours. No results found for: CHOL, HDL, LDLCALC, TRIG No results found for: DDIMER  Radiology/Studies:  No results found.   EKG: atrial fibrillation with acute inferoposterior STEMI  CARDIAC STUDIES: pending  ASSESSMENT AND PLAN:  1. Out-of-hospital VF arrest: ROSC after defibrillation and multiple rounds of epinephrine.  Lactate 12 is poor prognostic sign but considering young age and STEMI presentation will take emergently to the cath lab.   2. Acute inferoposterior STEMI: emergency cath and PCI.  3. Known CAD s/p CABG 2006: data reviewed. Previous LIMA-LAD and SVG-RCA. Suspect SVG occlusion as STEMI culprit.   4. PAD - undergoing evaluation for mesenteric ischemia  5. Atrial fibrillation - not previously documented, likely related to arrest/acute MI.   Plan:  will bring for emergent cath. Pt appears chronically ill with severe vascular disease, weight loss per review of records. Further disposition pending cath results. She will not be a candidate for hemodynamic support consider her frail body habitus and vascular disease. Will consult CCM for vent management and hypothermia.  Deatra James MD 09/18/2015, 11:52 PM

## 2015-09-23 ENCOUNTER — Inpatient Hospital Stay (HOSPITAL_COMMUNITY): Payer: Medicaid Other

## 2015-09-23 ENCOUNTER — Encounter (HOSPITAL_COMMUNITY): Payer: Self-pay | Admitting: Cardiovascular Disease

## 2015-09-23 ENCOUNTER — Ambulatory Visit: Payer: Medicaid Other | Admitting: Vascular Surgery

## 2015-09-23 ENCOUNTER — Ambulatory Visit (HOSPITAL_COMMUNITY): Payer: Medicaid Other

## 2015-09-23 ENCOUNTER — Other Ambulatory Visit: Payer: Medicaid Other

## 2015-09-23 DIAGNOSIS — R079 Chest pain, unspecified: Secondary | ICD-10-CM

## 2015-09-23 DIAGNOSIS — I241 Dressler's syndrome: Secondary | ICD-10-CM | POA: Diagnosis present

## 2015-09-23 DIAGNOSIS — Z923 Personal history of irradiation: Secondary | ICD-10-CM | POA: Diagnosis not present

## 2015-09-23 DIAGNOSIS — R739 Hyperglycemia, unspecified: Secondary | ICD-10-CM | POA: Diagnosis present

## 2015-09-23 DIAGNOSIS — I480 Paroxysmal atrial fibrillation: Secondary | ICD-10-CM | POA: Diagnosis not present

## 2015-09-23 DIAGNOSIS — J9601 Acute respiratory failure with hypoxia: Secondary | ICD-10-CM | POA: Diagnosis not present

## 2015-09-23 DIAGNOSIS — N179 Acute kidney failure, unspecified: Secondary | ICD-10-CM | POA: Diagnosis present

## 2015-09-23 DIAGNOSIS — R69 Illness, unspecified: Secondary | ICD-10-CM | POA: Diagnosis not present

## 2015-09-23 DIAGNOSIS — I252 Old myocardial infarction: Secondary | ICD-10-CM | POA: Diagnosis not present

## 2015-09-23 DIAGNOSIS — I25700 Atherosclerosis of coronary artery bypass graft(s), unspecified, with unstable angina pectoris: Secondary | ICD-10-CM | POA: Diagnosis not present

## 2015-09-23 DIAGNOSIS — G931 Anoxic brain damage, not elsewhere classified: Secondary | ICD-10-CM

## 2015-09-23 DIAGNOSIS — I469 Cardiac arrest, cause unspecified: Secondary | ICD-10-CM | POA: Diagnosis not present

## 2015-09-23 DIAGNOSIS — E876 Hypokalemia: Secondary | ICD-10-CM | POA: Diagnosis present

## 2015-09-23 DIAGNOSIS — K7200 Acute and subacute hepatic failure without coma: Secondary | ICD-10-CM | POA: Diagnosis present

## 2015-09-23 DIAGNOSIS — Z9221 Personal history of antineoplastic chemotherapy: Secondary | ICD-10-CM | POA: Diagnosis not present

## 2015-09-23 DIAGNOSIS — K559 Vascular disorder of intestine, unspecified: Secondary | ICD-10-CM | POA: Diagnosis present

## 2015-09-23 DIAGNOSIS — Z452 Encounter for adjustment and management of vascular access device: Secondary | ICD-10-CM | POA: Insufficient documentation

## 2015-09-23 DIAGNOSIS — Z952 Presence of prosthetic heart valve: Secondary | ICD-10-CM | POA: Diagnosis not present

## 2015-09-23 DIAGNOSIS — E872 Acidosis: Secondary | ICD-10-CM | POA: Diagnosis present

## 2015-09-23 DIAGNOSIS — D649 Anemia, unspecified: Secondary | ICD-10-CM | POA: Diagnosis present

## 2015-09-23 DIAGNOSIS — I472 Ventricular tachycardia: Secondary | ICD-10-CM | POA: Diagnosis not present

## 2015-09-23 DIAGNOSIS — Z8541 Personal history of malignant neoplasm of cervix uteri: Secondary | ICD-10-CM | POA: Diagnosis not present

## 2015-09-23 DIAGNOSIS — T82898A Other specified complication of vascular prosthetic devices, implants and grafts, initial encounter: Secondary | ICD-10-CM | POA: Diagnosis present

## 2015-09-23 DIAGNOSIS — Y832 Surgical operation with anastomosis, bypass or graft as the cause of abnormal reaction of the patient, or of later complication, without mention of misadventure at the time of the procedure: Secondary | ICD-10-CM | POA: Diagnosis present

## 2015-09-23 DIAGNOSIS — E785 Hyperlipidemia, unspecified: Secondary | ICD-10-CM | POA: Diagnosis present

## 2015-09-23 DIAGNOSIS — I2510 Atherosclerotic heart disease of native coronary artery without angina pectoris: Secondary | ICD-10-CM | POA: Diagnosis present

## 2015-09-23 DIAGNOSIS — I2119 ST elevation (STEMI) myocardial infarction involving other coronary artery of inferior wall: Secondary | ICD-10-CM | POA: Diagnosis not present

## 2015-09-23 DIAGNOSIS — R570 Cardiogenic shock: Secondary | ICD-10-CM | POA: Diagnosis not present

## 2015-09-23 DIAGNOSIS — L8990 Pressure ulcer of unspecified site, unspecified stage: Secondary | ICD-10-CM | POA: Insufficient documentation

## 2015-09-23 DIAGNOSIS — I2111 ST elevation (STEMI) myocardial infarction involving right coronary artery: Secondary | ICD-10-CM | POA: Diagnosis present

## 2015-09-23 DIAGNOSIS — R0902 Hypoxemia: Secondary | ICD-10-CM | POA: Insufficient documentation

## 2015-09-23 DIAGNOSIS — G9341 Metabolic encephalopathy: Secondary | ICD-10-CM | POA: Diagnosis present

## 2015-09-23 LAB — POCT I-STAT 3, ART BLOOD GAS (G3+)
ACID-BASE DEFICIT: 1 mmol/L (ref 0.0–2.0)
ACID-BASE DEFICIT: 3 mmol/L — AB (ref 0.0–2.0)
Acid-base deficit: 25 mmol/L — ABNORMAL HIGH (ref 0.0–2.0)
Bicarbonate: 25.2 mEq/L — ABNORMAL HIGH (ref 20.0–24.0)
Bicarbonate: 5.4 mEq/L — ABNORMAL LOW (ref 20.0–24.0)
Bicarbonate: 22.8 mEq/L (ref 20.0–24.0)
O2 Saturation: 100 %
O2 Saturation: 92 %
O2 Saturation: 100 %
PCO2 ART: 18 mmHg — AB (ref 35.0–45.0)
PH ART: 7.354 (ref 7.350–7.450)
PH ART: 7.035 — AB (ref 7.350–7.450)
TCO2: 27 mmol/L (ref 0–100)
TCO2: 6 mmol/L (ref 0–100)
TCO2: 24 mmol/L (ref 0–100)
pCO2 arterial: 44.8 mmHg (ref 35.0–45.0)
pCO2 arterial: 40.4 mmHg (ref 35.0–45.0)
pH, Arterial: 7.359 (ref 7.350–7.450)
pO2, Arterial: 487 mmHg — ABNORMAL HIGH (ref 80.0–100.0)
pO2, Arterial: 66 mmHg — ABNORMAL LOW (ref 80.0–100.0)
pO2, Arterial: 506 mmHg — ABNORMAL HIGH (ref 80.0–100.0)

## 2015-09-23 LAB — GLUCOSE, CAPILLARY
GLUCOSE-CAPILLARY: 303 mg/dL — AB (ref 65–99)
GLUCOSE-CAPILLARY: 167 mg/dL — AB (ref 65–99)
GLUCOSE-CAPILLARY: 185 mg/dL — AB (ref 65–99)
GLUCOSE-CAPILLARY: 391 mg/dL — AB (ref 65–99)
GLUCOSE-CAPILLARY: 412 mg/dL — AB (ref 65–99)
GLUCOSE-CAPILLARY: 444 mg/dL — AB (ref 65–99)
GLUCOSE-CAPILLARY: 430 mg/dL — AB (ref 65–99)
GLUCOSE-CAPILLARY: 434 mg/dL — AB (ref 65–99)
GLUCOSE-CAPILLARY: 305 mg/dL — AB (ref 65–99)
GLUCOSE-CAPILLARY: 432 mg/dL — AB (ref 65–99)
Glucose-Capillary: 262 mg/dL — ABNORMAL HIGH (ref 65–99)
Glucose-Capillary: 279 mg/dL — ABNORMAL HIGH (ref 65–99)
Glucose-Capillary: 233 mg/dL — ABNORMAL HIGH (ref 65–99)
Glucose-Capillary: 214 mg/dL — ABNORMAL HIGH (ref 65–99)
Glucose-Capillary: 394 mg/dL — ABNORMAL HIGH (ref 65–99)
Glucose-Capillary: 408 mg/dL — ABNORMAL HIGH (ref 65–99)
Glucose-Capillary: 341 mg/dL — ABNORMAL HIGH (ref 65–99)

## 2015-09-23 LAB — POCT I-STAT, CHEM 8
BUN: 13 mg/dL (ref 6–20)
BUN: 15 mg/dL (ref 6–20)
CALCIUM ION: 1.1 mmol/L — AB (ref 1.12–1.23)
CALCIUM ION: 0.97 mmol/L — AB (ref 1.12–1.23)
CHLORIDE: 103 mmol/L (ref 101–111)
Chloride: 103 mmol/L (ref 101–111)
Creatinine, Ser: 0.9 mg/dL (ref 0.44–1.00)
Creatinine, Ser: 0.9 mg/dL (ref 0.44–1.00)
GLUCOSE: 273 mg/dL — AB (ref 65–99)
GLUCOSE: 323 mg/dL — AB (ref 65–99)
HCT: 28 % — ABNORMAL LOW (ref 36.0–46.0)
HEMATOCRIT: 25 % — AB (ref 36.0–46.0)
HEMOGLOBIN: 8.5 g/dL — AB (ref 12.0–15.0)
HEMOGLOBIN: 9.5 g/dL — AB (ref 12.0–15.0)
Potassium: 2.3 mmol/L — CL (ref 3.5–5.1)
Potassium: 3.6 mmol/L (ref 3.5–5.1)
SODIUM: 139 mmol/L (ref 135–145)
Sodium: 138 mmol/L (ref 135–145)
TCO2: 20 mmol/L (ref 0–100)
TCO2: 19 mmol/L (ref 0–100)

## 2015-09-23 LAB — BASIC METABOLIC PANEL
ANION GAP: 15 (ref 5–15)
Anion gap: 14 (ref 5–15)
Anion gap: 13 (ref 5–15)
Anion gap: 15 (ref 5–15)
Anion gap: 25 — ABNORMAL HIGH (ref 5–15)
Anion gap: 25 — ABNORMAL HIGH (ref 5–15)
Anion gap: 21 — ABNORMAL HIGH (ref 5–15)
BUN: 14 mg/dL (ref 6–20)
BUN: 18 mg/dL (ref 6–20)
BUN: 16 mg/dL (ref 6–20)
BUN: 17 mg/dL (ref 6–20)
BUN: 16 mg/dL (ref 6–20)
BUN: 17 mg/dL (ref 6–20)
BUN: 15 mg/dL (ref 6–20)
CALCIUM: 7.2 mg/dL — AB (ref 8.9–10.3)
CHLORIDE: 106 mmol/L (ref 101–111)
CHLORIDE: 107 mmol/L (ref 101–111)
CHLORIDE: 98 mmol/L — AB (ref 101–111)
CO2: 18 mmol/L — ABNORMAL LOW (ref 22–32)
CO2: 15 mmol/L — ABNORMAL LOW (ref 22–32)
CO2: 13 mmol/L — ABNORMAL LOW (ref 22–32)
CO2: 13 mmol/L — ABNORMAL LOW (ref 22–32)
CO2: 11 mmol/L — ABNORMAL LOW (ref 22–32)
CO2: 11 mmol/L — AB (ref 22–32)
CO2: 22 mmol/L (ref 22–32)
CREATININE: 1.27 mg/dL — AB (ref 0.44–1.00)
CREATININE: 1.43 mg/dL — AB (ref 0.44–1.00)
CREATININE: 1.56 mg/dL — AB (ref 0.44–1.00)
CREATININE: 1.72 mg/dL — AB (ref 0.44–1.00)
CREATININE: 1.79 mg/dL — AB (ref 0.44–1.00)
Calcium: 7.3 mg/dL — ABNORMAL LOW (ref 8.9–10.3)
Calcium: 7.1 mg/dL — ABNORMAL LOW (ref 8.9–10.3)
Calcium: 7.3 mg/dL — ABNORMAL LOW (ref 8.9–10.3)
Calcium: 6.3 mg/dL — CL (ref 8.9–10.3)
Calcium: 6.1 mg/dL — CL (ref 8.9–10.3)
Calcium: 7.1 mg/dL — ABNORMAL LOW (ref 8.9–10.3)
Chloride: 109 mmol/L (ref 101–111)
Chloride: 105 mmol/L (ref 101–111)
Chloride: 99 mmol/L — ABNORMAL LOW (ref 101–111)
Chloride: 93 mmol/L — ABNORMAL LOW (ref 101–111)
Creatinine, Ser: 1.12 mg/dL — ABNORMAL HIGH (ref 0.44–1.00)
Creatinine, Ser: 1.33 mg/dL — ABNORMAL HIGH (ref 0.44–1.00)
GFR calc Af Amer: 56 mL/min — ABNORMAL LOW (ref >60)
GFR calc Af Amer: 48 mL/min — ABNORMAL LOW (ref >60)
GFR calc Af Amer: 43 mL/min — ABNORMAL LOW (ref >60)
GFR calc Af Amer: 39 mL/min — ABNORMAL LOW (ref >60)
GFR calc Af Amer: 37 mL/min — ABNORMAL LOW (ref >60)
GFR calc non Af Amer: 56 mL/min — ABNORMAL LOW (ref >60)
GFR calc non Af Amer: 45 mL/min — ABNORMAL LOW (ref >60)
GFR calc non Af Amer: 33 mL/min — ABNORMAL LOW (ref >60)
GFR, EST AFRICAN AMERICAN: 53 mL/min — AB (ref >60)
GFR, EST NON AFRICAN AMERICAN: 48 mL/min — AB (ref >60)
GFR, EST NON AFRICAN AMERICAN: 42 mL/min — AB (ref >60)
GFR, EST NON AFRICAN AMERICAN: 37 mL/min — AB (ref >60)
GFR, EST NON AFRICAN AMERICAN: 32 mL/min — AB (ref >60)
GLUCOSE: 481 mg/dL — AB (ref 65–99)
Glucose, Bld: 294 mg/dL — ABNORMAL HIGH (ref 65–99)
Glucose, Bld: 359 mg/dL — ABNORMAL HIGH (ref 65–99)
Glucose, Bld: 378 mg/dL — ABNORMAL HIGH (ref 65–99)
Glucose, Bld: 440 mg/dL — ABNORMAL HIGH (ref 65–99)
Glucose, Bld: 450 mg/dL — ABNORMAL HIGH (ref 65–99)
Glucose, Bld: 477 mg/dL — ABNORMAL HIGH (ref 65–99)
POTASSIUM: 2.3 mmol/L — AB (ref 3.5–5.1)
POTASSIUM: 5.2 mmol/L — AB (ref 3.5–5.1)
POTASSIUM: 3.6 mmol/L (ref 3.5–5.1)
POTASSIUM: 3.2 mmol/L — AB (ref 3.5–5.1)
Potassium: 5.6 mmol/L — ABNORMAL HIGH (ref 3.5–5.1)
Potassium: 4.8 mmol/L (ref 3.5–5.1)
Potassium: 4.9 mmol/L (ref 3.5–5.1)
SODIUM: 138 mmol/L (ref 135–145)
SODIUM: 137 mmol/L (ref 135–145)
SODIUM: 133 mmol/L — AB (ref 135–145)
SODIUM: 135 mmol/L (ref 135–145)
SODIUM: 136 mmol/L (ref 135–145)
Sodium: 135 mmol/L (ref 135–145)
Sodium: 134 mmol/L — ABNORMAL LOW (ref 135–145)

## 2015-09-23 LAB — TROPONIN I
Troponin I: >65 ng/mL (ref <0.031)
Troponin I: >65 ng/mL (ref <0.031)
Troponin I: >65 ng/mL (ref <0.031)

## 2015-09-23 LAB — CBC
HCT: 26 % — ABNORMAL LOW (ref 36.0–46.0)
HEMATOCRIT: 26.4 % — AB (ref 36.0–46.0)
HEMOGLOBIN: 8 g/dL — AB (ref 12.0–15.0)
Hemoglobin: 8.2 g/dL — ABNORMAL LOW (ref 12.0–15.0)
MCH: 29 pg (ref 26.0–34.0)
MCH: 28.7 pg (ref 26.0–34.0)
MCHC: 31.5 g/dL (ref 30.0–36.0)
MCHC: 30.3 g/dL (ref 30.0–36.0)
MCV: 91.9 fL (ref 78.0–100.0)
MCV: 94.6 fL (ref 78.0–100.0)
PLATELETS: 294 10*3/uL (ref 150–400)
Platelets: 389 10*3/uL (ref 150–400)
RBC: 2.83 MIL/uL — AB (ref 3.87–5.11)
RBC: 2.79 MIL/uL — AB (ref 3.87–5.11)
RDW: 14.9 % (ref 11.5–15.5)
RDW: 15 % (ref 11.5–15.5)
WBC: 14.1 10*3/uL — AB (ref 4.0–10.5)
WBC: 13.6 10*3/uL — ABNORMAL HIGH (ref 4.0–10.5)

## 2015-09-23 LAB — APTT: APTT: 56 s — AB (ref 24–37)

## 2015-09-23 LAB — PROTIME-INR
INR: 1.79 — AB (ref 0.00–1.49)
INR: 2.01 — ABNORMAL HIGH (ref 0.00–1.49)
PROTHROMBIN TIME: 20.8 s — AB (ref 11.6–15.2)
Prothrombin Time: 22.6 seconds — ABNORMAL HIGH (ref 11.6–15.2)

## 2015-09-23 LAB — POCT ACTIVATED CLOTTING TIME
ACTIVATED CLOTTING TIME: 229 s
ACTIVATED CLOTTING TIME: 302 s
Activated Clotting Time: 193 seconds

## 2015-09-23 LAB — BLOOD GAS, ARTERIAL
Acid-base deficit: 16.3 mmol/L — ABNORMAL HIGH (ref 0.0–2.0)
BICARBONATE: 10.9 meq/L — AB (ref 20.0–24.0)
FIO2: 0.4
LHR: 18 {breaths}/min
O2 Saturation: 94.3 %
PEEP: 5 cmH2O
Patient temperature: 87.8
TCO2: 11.9 mmol/L (ref 0–100)
VT: 480 mL
pCO2 arterial: 25 mmHg — ABNORMAL LOW (ref 35.0–45.0)
pH, Arterial: 7.218 — ABNORMAL LOW (ref 7.350–7.450)
pO2, Arterial: 76.2 mmHg — ABNORMAL LOW (ref 80.0–100.0)

## 2015-09-23 LAB — LACTIC ACID, PLASMA
LACTIC ACID, VENOUS: 4.2 mmol/L — AB (ref 0.5–2.0)
Lactic Acid, Venous: 11.9 mmol/L (ref 0.5–2.0)

## 2015-09-23 LAB — MRSA PCR SCREENING: MRSA BY PCR: NEGATIVE

## 2015-09-23 LAB — ECHOCARDIOGRAM LIMITED
HEIGHTINCHES: 66 in
WEIGHTICAEL: 1626.11 [oz_av]

## 2015-09-23 LAB — PHOSPHORUS: Phosphorus: 5.8 mg/dL — ABNORMAL HIGH (ref 2.5–4.6)

## 2015-09-23 LAB — MAGNESIUM: Magnesium: 1.6 mg/dL — ABNORMAL LOW (ref 1.7–2.4)

## 2015-09-23 LAB — CG4 I-STAT (LACTIC ACID): LACTIC ACID, VENOUS: 16.03 mmol/L — AB (ref 0.5–2.0)

## 2015-09-23 LAB — BRAIN NATRIURETIC PEPTIDE: B Natriuretic Peptide: 132.9 pg/mL — ABNORMAL HIGH (ref 0.0–100.0)

## 2015-09-23 LAB — TSH: TSH: 0.693 u[IU]/mL (ref 0.350–4.500)

## 2015-09-23 MED ORDER — ASPIRIN 81 MG PO CHEW
81.0000 mg | CHEWABLE_TABLET | Freq: Every day | ORAL | Status: DC
  Administered 2015-09-24: 81 mg via ORAL
  Filled 2015-09-23: qty 1

## 2015-09-23 MED ORDER — INSULIN ASPART 100 UNIT/ML ~~LOC~~ SOLN
0.0000 [IU] | SUBCUTANEOUS | Status: DC
  Administered 2015-09-23: 11 [IU] via SUBCUTANEOUS
  Administered 2015-09-23: 8 [IU] via SUBCUTANEOUS

## 2015-09-23 MED ORDER — NITROGLYCERIN 0.4 MG SL SUBL: 0.4000 mg | SUBLINGUAL_TABLET | SUBLINGUAL | Status: DC | PRN

## 2015-09-23 MED ORDER — CISATRACURIUM BOLUS VIA INFUSION
0.1000 mg/kg | Freq: Once | INTRAVENOUS | Status: AC
  Administered 2015-09-23: 5 mg via INTRAVENOUS
  Filled 2015-09-23: qty 5

## 2015-09-23 MED ORDER — MIDAZOLAM HCL 2 MG/2ML IJ SOLN: 2.0000 mg | INTRAMUSCULAR | Status: DC | PRN

## 2015-09-23 MED ORDER — SODIUM BICARBONATE 8.4 % IV SOLN
INTRAVENOUS | Status: AC
  Filled 2015-09-23: qty 100

## 2015-09-23 MED ORDER — HEPARIN SODIUM (PORCINE) 1000 UNIT/ML IJ SOLN
INTRAMUSCULAR | Status: DC | PRN
  Administered 2015-09-23: 5000 [IU] via INTRAVENOUS
  Administered 2015-09-23: 4000 [IU] via INTRAVENOUS

## 2015-09-23 MED ORDER — SODIUM BICARBONATE 8.4 % IV SOLN
100.0000 meq | Freq: Once | INTRAVENOUS | Status: AC
  Administered 2015-09-23: 100 meq via INTRAVENOUS

## 2015-09-23 MED ORDER — SODIUM CHLORIDE 0.9 % IV SOLN
0.0300 [IU]/min | INTRAVENOUS | Status: DC
  Administered 2015-09-23 – 2015-09-24 (×2): 0.03 [IU]/min via INTRAVENOUS
  Filled 2015-09-23 (×2): qty 2

## 2015-09-23 MED ORDER — PROPOFOL 1000 MG/100ML IV EMUL: 5.0000 ug/kg/min | INTRAVENOUS | Status: DC

## 2015-09-23 MED ORDER — PHENYLEPHRINE HCL 10 MG/ML IJ SOLN
0.0000 ug/min | INTRAMUSCULAR | Status: DC
  Administered 2015-09-23: 200 ug/min via INTRAVENOUS
  Administered 2015-09-23: 400 ug/min via INTRAVENOUS
  Administered 2015-09-23: 200 ug/min via INTRAVENOUS
  Administered 2015-09-24: 100 ug/min via INTRAVENOUS
  Administered 2015-09-24: 230 ug/min via INTRAVENOUS
  Filled 2015-09-23 (×8): qty 4

## 2015-09-23 MED ORDER — LIDOCAINE HCL (PF) 1 % IJ SOLN
INTRAMUSCULAR | Status: DC | PRN
  Administered 2015-09-23: 20 mL

## 2015-09-23 MED ORDER — FENTANYL CITRATE (PF) 100 MCG/2ML IJ SOLN
100.0000 ug | Freq: Once | INTRAMUSCULAR | Status: AC
  Administered 2015-09-23: 100 ug via INTRAVENOUS

## 2015-09-23 MED ORDER — HEPARIN SODIUM (PORCINE) 5000 UNIT/ML IJ SOLN
5000.0000 [IU] | Freq: Three times a day (TID) | INTRAMUSCULAR | Status: DC
  Administered 2015-09-23 – 2015-09-24 (×3): 5000 [IU] via SUBCUTANEOUS
  Filled 2015-09-23 (×3): qty 1

## 2015-09-23 MED ORDER — TICAGRELOR 90 MG PO TABS: 90.0000 mg | ORAL_TABLET | Freq: Two times a day (BID) | ORAL | Status: DC

## 2015-09-23 MED ORDER — TICAGRELOR 90 MG PO TABS
ORAL_TABLET | ORAL | Status: AC
  Filled 2015-09-23: qty 1

## 2015-09-23 MED ORDER — ATORVASTATIN CALCIUM 80 MG PO TABS
80.0000 mg | ORAL_TABLET | Freq: Every day | ORAL | Status: DC
  Administered 2015-09-23: 80 mg via ORAL
  Filled 2015-09-23: qty 1

## 2015-09-23 MED ORDER — NOREPINEPHRINE BITARTRATE 1 MG/ML IV SOLN
0.0000 ug/min | INTRAVENOUS | Status: DC
  Administered 2015-09-23: 35 ug/min via INTRAVENOUS
  Administered 2015-09-23: 80 ug/min via INTRAVENOUS
  Administered 2015-09-23: 82 ug/min via INTRAVENOUS
  Administered 2015-09-23: 90 ug/min via INTRAVENOUS
  Administered 2015-09-24: 50 ug/min via INTRAVENOUS
  Administered 2015-09-24: 40 ug/min via INTRAVENOUS
  Filled 2015-09-23 (×7): qty 16

## 2015-09-23 MED ORDER — CALCIUM CHLORIDE 10 % IV SOLN
1.0000 g | Freq: Once | INTRAVENOUS | Status: AC
  Administered 2015-09-23: 1 g via INTRAVENOUS

## 2015-09-23 MED ORDER — TICAGRELOR 90 MG PO TABS
ORAL_TABLET | ORAL | Status: DC | PRN
  Administered 2015-09-23: 180 mg

## 2015-09-23 MED ORDER — IOPAMIDOL (ISOVUE-370) INJECTION 76%
INTRAVENOUS | Status: DC | PRN
  Administered 2015-09-23: 70 mL via INTRA_ARTERIAL

## 2015-09-23 MED ORDER — ONDANSETRON HCL 4 MG/2ML IJ SOLN: 4.0000 mg | Freq: Four times a day (QID) | INTRAMUSCULAR | Status: DC | PRN

## 2015-09-23 MED ORDER — SODIUM CHLORIDE 0.9% FLUSH: 3.0000 mL | INTRAVENOUS | Status: DC | PRN

## 2015-09-23 MED ORDER — FENTANYL BOLUS VIA INFUSION
50.0000 ug | INTRAVENOUS | Status: DC | PRN
  Filled 2015-09-23: qty 50

## 2015-09-23 MED ORDER — POTASSIUM CHLORIDE 20 MEQ/15ML (10%) PO SOLN
40.0000 meq | Freq: Once | ORAL | Status: AC
  Administered 2015-09-23: 40 meq
  Filled 2015-09-23: qty 30

## 2015-09-23 MED ORDER — TICAGRELOR 90 MG PO TABS
90.0000 mg | ORAL_TABLET | Freq: Two times a day (BID) | ORAL | Status: DC
Start: 2015-09-23 — End: 2015-09-24
  Administered 2015-09-23 – 2015-09-24 (×2): 90 mg via ORAL
  Filled 2015-09-23 (×2): qty 1

## 2015-09-23 MED ORDER — IOPAMIDOL (ISOVUE-370) INJECTION 76%
INTRAVENOUS | Status: AC
  Filled 2015-09-23: qty 100

## 2015-09-23 MED ORDER — SODIUM CHLORIDE 0.9 % IV SOLN
250.0000 mL | INTRAVENOUS | Status: DC | PRN
  Administered 2015-09-23: 10 mL via INTRAVENOUS

## 2015-09-23 MED ORDER — NOREPINEPHRINE BITARTRATE 1 MG/ML IV SOLN
0.0000 ug/min | INTRAVENOUS | Status: DC
  Administered 2015-09-23: 30 ug/min via INTRAVENOUS
  Administered 2015-09-23: 10 ug/min via INTRAVENOUS
  Filled 2015-09-23 (×4): qty 4

## 2015-09-23 MED ORDER — HEPARIN (PORCINE) IN NACL 2-0.9 UNIT/ML-% IJ SOLN
INTRAMUSCULAR | Status: DC | PRN
  Administered 2015-09-23: 1000 mL

## 2015-09-23 MED ORDER — HEPARIN SODIUM (PORCINE) 1000 UNIT/ML IJ SOLN
INTRAMUSCULAR | Status: AC
  Filled 2015-09-23: qty 1

## 2015-09-23 MED ORDER — AMIODARONE HCL IN DEXTROSE 360-4.14 MG/200ML-% IV SOLN
60.0000 mg/h | INTRAVENOUS | Status: DC
  Administered 2015-09-23: 60 mg/h via INTRAVENOUS
  Filled 2015-09-23: qty 200

## 2015-09-23 MED ORDER — ANTISEPTIC ORAL RINSE SOLUTION (CORINZ)
7.0000 mL | OROMUCOSAL | Status: DC
  Administered 2015-09-23 – 2015-09-24 (×13): 7 mL via OROMUCOSAL

## 2015-09-23 MED ORDER — SODIUM CHLORIDE 0.9 % IV SOLN
0.0000 mg/h | INTRAVENOUS | Status: DC
  Administered 2015-09-23: 2 mg/h via INTRAVENOUS
  Filled 2015-09-23: qty 10

## 2015-09-23 MED ORDER — POTASSIUM CHLORIDE 10 MEQ/50ML IV SOLN
10.0000 meq | INTRAVENOUS | Status: DC
  Administered 2015-09-23 (×5): 10 meq via INTRAVENOUS
  Filled 2015-09-23 (×6): qty 50

## 2015-09-23 MED ORDER — SODIUM CHLORIDE 0.9% FLUSH: 3.0000 mL | Freq: Two times a day (BID) | INTRAVENOUS | Status: DC

## 2015-09-23 MED ORDER — SODIUM BICARBONATE 8.4 % IV SOLN: 100.0000 meq | Freq: Once | INTRAVENOUS | Status: DC

## 2015-09-23 MED ORDER — ALBUTEROL SULFATE (2.5 MG/3ML) 0.083% IN NEBU: 2.5000 mg | INHALATION_SOLUTION | RESPIRATORY_TRACT | Status: DC | PRN

## 2015-09-23 MED ORDER — MAGNESIUM SULFATE 4 GM/100ML IV SOLN
4.0000 g | Freq: Once | INTRAVENOUS | Status: AC
  Administered 2015-09-23: 4 g via INTRAVENOUS
  Filled 2015-09-23: qty 100

## 2015-09-23 MED ORDER — CISATRACURIUM BOLUS VIA INFUSION
0.0500 mg/kg | INTRAVENOUS | Status: DC | PRN
  Filled 2015-09-23: qty 3

## 2015-09-23 MED ORDER — CHLORHEXIDINE GLUCONATE 0.12% ORAL RINSE (MEDLINE KIT)
15.0000 mL | Freq: Two times a day (BID) | OROMUCOSAL | Status: DC
  Administered 2015-09-23 – 2015-09-24 (×3): 15 mL via OROMUCOSAL

## 2015-09-23 MED ORDER — SODIUM CHLORIDE 0.9 % IV SOLN
25.0000 ug/h | INTRAVENOUS | Status: DC
  Administered 2015-09-23 (×2): 150 ug/h via INTRAVENOUS
  Filled 2015-09-23 (×2): qty 50

## 2015-09-23 MED ORDER — PHENYLEPHRINE HCL 10 MG/ML IJ SOLN
0.0000 ug/min | INTRAVENOUS | Status: DC
  Filled 2015-09-23: qty 1

## 2015-09-23 MED ORDER — ASPIRIN 300 MG RE SUPP
300.0000 mg | RECTAL | Status: AC
  Administered 2015-09-23: 300 mg via RECTAL
  Filled 2015-09-23: qty 1

## 2015-09-23 MED ORDER — STERILE WATER FOR INJECTION IV SOLN
INTRAVENOUS | Status: DC
  Administered 2015-09-23 – 2015-09-24 (×2): via INTRAVENOUS
  Filled 2015-09-23 (×9): qty 850

## 2015-09-23 MED ORDER — SODIUM CHLORIDE 0.9 % IV SOLN
2.0000 g | Freq: Once | INTRAVENOUS | Status: AC
  Administered 2015-09-23: 2 g via INTRAVENOUS
  Filled 2015-09-23: qty 20

## 2015-09-23 MED ORDER — SODIUM CHLORIDE 0.9 % IV SOLN: INTRAVENOUS | Status: DC

## 2015-09-23 MED ORDER — AMIODARONE LOAD VIA INFUSION
150.0000 mg | Freq: Once | INTRAVENOUS | Status: AC
  Administered 2015-09-23: 150 mg via INTRAVENOUS
  Filled 2015-09-23: qty 83.34

## 2015-09-23 MED ORDER — EPINEPHRINE HCL 1 MG/ML IJ SOLN
0.5000 ug/min | INTRAVENOUS | Status: DC
  Administered 2015-09-23: 20 ug/min via INTRAVENOUS
  Administered 2015-09-23: 0.5 ug/min via INTRAVENOUS
  Administered 2015-09-23 – 2015-09-24 (×2): 20 ug/min via INTRAVENOUS
  Administered 2015-09-24: 19 ug/min via INTRAVENOUS
  Administered 2015-09-24: 18 ug/min via INTRAVENOUS
  Filled 2015-09-23 (×8): qty 4

## 2015-09-23 MED ORDER — AMIODARONE HCL IN DEXTROSE 360-4.14 MG/200ML-% IV SOLN
30.0000 mg/h | INTRAVENOUS | Status: DC
  Administered 2015-09-23 – 2015-09-24 (×3): 30 mg/h via INTRAVENOUS
  Filled 2015-09-23 (×3): qty 200

## 2015-09-23 MED ORDER — SODIUM BICARBONATE 8.4 % IV SOLN
INTRAVENOUS | Status: AC
Start: 2015-09-23 — End: 2015-09-23
  Administered 2015-09-23: 50 meq
  Filled 2015-09-23: qty 50

## 2015-09-23 MED ORDER — AMIODARONE HCL IN DEXTROSE 360-4.14 MG/200ML-% IV SOLN
30.0000 mg/h | INTRAVENOUS | Status: DC
Start: 2015-09-23 — End: 2015-09-23

## 2015-09-23 MED ORDER — SODIUM CHLORIDE 0.9 % IV SOLN
1.0000 g | Freq: Once | INTRAVENOUS | Status: AC
  Administered 2015-09-23: 1 g via INTRAVENOUS
  Filled 2015-09-23: qty 10

## 2015-09-23 MED ORDER — ARTIFICIAL TEARS OP OINT
1.0000 "application " | TOPICAL_OINTMENT | Freq: Three times a day (TID) | OPHTHALMIC | Status: DC
  Administered 2015-09-23 – 2015-09-24 (×4): 1 via OPHTHALMIC
  Filled 2015-09-23: qty 3.5

## 2015-09-23 MED ORDER — PANTOPRAZOLE SODIUM 40 MG IV SOLR
40.0000 mg | Freq: Every day | INTRAVENOUS | Status: DC
Start: 2015-09-23 — End: 2015-09-24
  Administered 2015-09-23 (×2): 40 mg via INTRAVENOUS
  Filled 2015-09-23 (×2): qty 40

## 2015-09-23 MED ORDER — SODIUM CHLORIDE 0.9 % IV SOLN
1.0000 ug/kg/min | INTRAVENOUS | Status: DC
  Administered 2015-09-23: 1 ug/kg/min via INTRAVENOUS
  Filled 2015-09-23: qty 20

## 2015-09-23 MED ORDER — SODIUM BICARBONATE 8.4 % IV SOLN
INTRAVENOUS | Status: AC
  Administered 2015-09-23: 100 meq
  Filled 2015-09-23: qty 100

## 2015-09-23 MED ORDER — SODIUM CHLORIDE 0.9 % IV SOLN
INTRAVENOUS | Status: DC
  Administered 2015-09-23: 1.5 [IU]/h via INTRAVENOUS
  Administered 2015-09-24: 16.9 [IU]/h via INTRAVENOUS
  Filled 2015-09-23 (×3): qty 2.5

## 2015-09-23 MED ORDER — MIDAZOLAM BOLUS VIA INFUSION
1.0000 mg | INTRAVENOUS | Status: DC | PRN
  Filled 2015-09-23: qty 2

## 2015-09-23 MED ORDER — SODIUM BICARBONATE 8.4 % IV SOLN
50.0000 meq | Freq: Once | INTRAVENOUS | Status: AC
  Administered 2015-09-23: 50 meq via INTRAVENOUS
  Filled 2015-09-23: qty 50

## 2015-09-23 MED ORDER — ACETAMINOPHEN 325 MG PO TABS: 650.0000 mg | ORAL_TABLET | ORAL | Status: DC | PRN

## 2015-09-23 MED ORDER — HEPARIN SODIUM (PORCINE) 5000 UNIT/ML IJ SOLN: 5000.0000 [IU] | Freq: Three times a day (TID) | INTRAMUSCULAR | Status: DC

## 2015-09-23 MED ORDER — SODIUM CHLORIDE 0.9 % IV SOLN
2000.0000 mL | Freq: Once | INTRAVENOUS | Status: AC
  Administered 2015-09-23: 2000 mL via INTRAVENOUS

## 2015-09-23 MED FILL — Nitroglycerin IV Soln 100 MCG/ML in D5W: INTRA_ARTERIAL | Qty: 10 | Status: AC

## 2015-09-23 NOTE — Progress Notes (Signed)
[facility] Progress Note Patient Name: SRIHITHA GODARD DOB: [birth year] MRN: [id number]   Date of Service  09/23/2015  HPI/Events of Note  Ca++ = 6.1.  eICU Interventions  Will replete Ca++.      Intervention Category Intermediate Interventions: Electrolyte abnormality - evaluation and management  Lysle Dingwall 09/23/2015, 8:47 PM

## 2015-09-23 NOTE — Progress Notes (Signed)
MD paged about pt's drop in BP and need for more levophed; also notified of pt's bradycarida and pauses and high potassium on lab.  Will continue to monitor very closely and update as needed.

## 2015-09-23 NOTE — Care Management Note (Signed)
Case Management Note  Patient Details  Name: WEYLYN LOWHORN MRN: [id number] Date of Birth: 1966/02/16  Subjective/Objective:     Adm w cardiac arrest, vent               Action/Plan: lives w fam, pcp dr Hassell Done   Expected Discharge Date:                  Expected Discharge Plan:  [place]  In-House Referral:     Discharge planning Services     Post Acute Care Choice:    Choice offered to:     DME Arranged:    DME Agency:     HH Arranged:    [place] Agency:     Status of Service:     Medicare Important Message Given:    Date Medicare IM Given:    Medicare IM give by:    Date Additional Medicare IM Given:    Additional Medicare Important Message give by:     If discussed at [place] of Stay Meetings, dates discussed:    Additional Comments: ur review done.  Lacretia Leigh, RN 09/23/2015, 7:40 AM

## 2015-09-23 NOTE — Progress Notes (Signed)
Called to bedside by nursing staff. Pt has had progressive decline this afternoon. She is now on multiple pressors with ongoing hypotension following asystolic arrest. She is intubated and sedated. The cooling protocol has been stopped and she is now being rewarmed. Her pH is 7.03. She is now on a bicarb drip. Her BP has responded to this. I think she is too unstable to even place a balloon pump or Impella now. She is mottled, cold, grey. I have updated her family at the bedside along with the PCCM team. I have reviewed her case with our CHF team but I do not think there are any good options to escalate care at this time. Will continue current treatment plan as her son does not wish to make her a DNR at this time.   MCALHANY,CHRISTOPHER 09/23/2015 3:18 PM

## 2015-09-23 NOTE — Progress Notes (Signed)
     SUBJECTIVE:Sedated, intubated.   Tele: sinus with runs of NSVT.   BP 148/86 mmHg  Pulse 67  Temp(Src) 87.8 F (31 C) (Core (Comment))  Resp 4  Ht 5\' 6"  (1.676 m)  Wt 101 lb 10.1 oz (46.1 kg)  BMI 16.41 kg/m2  SpO2 100%  LMP   Intake/Output Summary (Last 24 hours) at 09/23/15 0754 Last data filed at 09/23/15 0700  Gross per 24 hour  Intake 408.15 ml  Output    125 ml  Net 283.15 ml    PHYSICAL EXAM General: Sedated, intubated.  Psych:  Sedated Neck:No masses noted.  Lungs: Mechanical BS bilaterally with no wheezes or rhonci noted.  Heart: RRR with no murmurs noted. Abdomen: Bowel sounds are present. Soft, non-tender.  Extremities: No lower extremity edema.   LABS: Basic Metabolic Panel:  Recent Labs  09/23/15 0400 09/23/15 0420  NA 139 138  K 2.3* 2.3*  CL 103 106  CO2  --  18*  GLUCOSE 273* 294*  BUN 13 14  CREATININE 0.90 1.12*  CALCIUM  --  7.3*  MG  --  1.6*  PHOS  --  5.8*   CBC:  Recent Labs  09/23/15 0400 09/23/15 0420  WBC  --  14.1*  HGB 8.5* 8.2*  HCT 25.0* 26.0*  MCV  --  91.9  PLT  --  294   Cardiac Enzymes:  Recent Labs  09/23/15 0400  TROPONINI >65.00*   Current Meds: . artificial tears  1 application Both Eyes 3 times per day  . aspirin  81 mg Oral Daily  . atorvastatin  80 mg Oral q1800  . heparin  5,000 Units Subcutaneous 3 times per day  . insulin aspart  0-15 Units Subcutaneous 6 times per day  . magnesium sulfate 1 - 4 g bolus IVPB  4 g Intravenous Once  . pantoprazole (PROTONIX) IV  40 mg Intravenous QHS  . potassium chloride  10 mEq Intravenous Q1 Hr x 6  . propofol      . sodium chloride flush  3 mL Intravenous Q12H  . [START ON 2015/10/14] ticagrelor  90 mg Oral BID   Cath 09/23/2015:  Prox LAD lesion, 100% stenosed.  LIMA .  Ost Ramus to Ramus lesion, 40% stenosed.  Prox Cx lesion, 25% stenosed.  Prox RCA lesion, 100% stenosed.  SVG .  Prox Graft lesion, 100% stenosed. Post intervention, there  is a 0% residual stenosis.  There is moderate left ventricular systolic dysfunction.  1. Severe 2 vessel CAD with total occlusion of the RCA and proximal LAD 2. Patent LIMA - LAD 3. Total occlusion of the SVG-RCA treated successfully with Primary PCI (DES). Pt loaded with Brilinta 180 mg in cath lab   ASSESSMENT AND PLAN:  1. Out-of-hospital VF arrest/CAD s/p CABG/acute inferoposterior STEMI:  Pt admitted with STEMI, post arrest. Cath with occluded SVG to RCA. Now s/p DES to SVG to RCA. Troponin over 65. She is on ASA, Brilinta and statin. Echo is pending. LVEF is 35-40 by LV gram.   2. Atrial fibrillation, paroxysmal: Not previously documented, likely related to arrest/acute MI.  She is also having NSVT this am. Will start amiodarone drip.   Talon Regala  3/31/20177:54 AM

## 2015-09-23 NOTE — Progress Notes (Signed)
[facility] Progress Note Patient Name: [name] DOB: 06-15-1966 MRN: [id number]   Date of Service  09/23/2015  HPI/Events of Note  ABG pH = 7.21 and AFIB rate remains in 130's.   eICU Interventions  Will order: 1. Bolus with Amiodarone 150 mg IV over 10 minutes now. 2. NaHCO3 100 meq IV now.      Intervention Category Major Interventions: Arrhythmia - evaluation and management;Acid-Base disturbance - evaluation and management  Shawndell Schillaci Eugene 09/23/2015, 6:23 PM

## 2015-09-23 NOTE — Progress Notes (Signed)
CRITICAL VALUE ALERT  Critical value received:  Lactic acid 4.2  Date of notification:  09/23/15  Time of notification:  [id number]  Critical value read back:Yes.    Nurse who received alert:  n Lakara Weiland rn  MD notified (1st page):  Tennis Must dios  Time of first page:  MD at bedside  MD notified (2nd page):  Time of second page:  Responding MD:  Tennis Must dios  Time MD responded:  MD at bedside

## 2015-09-23 NOTE — Procedures (Signed)
Central Venous Catheter Insertion Procedure Note [name] [id number] Apr 10, 1966  Procedure: Insertion of Central Venous Catheter Indications: Assessment of intravascular volume, Drug and/or fluid administration and Frequent blood sampling  Procedure Details Consent: Unable to obtain consent because of altered level of consciousness. Time Out: Verified patient identification, verified procedure, site/side was marked, verified correct patient position, special equipment/implants available, medications/allergies/relevent history reviewed, required imaging and test results available.  Performed  Maximum sterile technique was used including antiseptics, cap, gloves, gown, hand hygiene, mask and sheet. Skin prep: Chlorhexidine; local anesthetic administered A antimicrobial bonded/coated triple lumen catheter was placed in the left internal jugular vein using the Seldinger technique.  Evaluation Blood flow good Complications: No apparent complications Patient did tolerate procedure well. Chest X-ray ordered to verify placement.  CXR: pending.  Procedure performed under direct ultrasound guidance for real time vessel cannulation.      [name], [facility] Pager: [phone]  or [phone] 09/23/2015, 1:54 AM

## 2015-09-23 NOTE — Progress Notes (Signed)
CRITICAL VALUE ALERT  Critical value received: ca=6.1  Date of notification:  09/23/2015  Time of notification:  2010  Critical value read back:yes  Nurse who received alert:  Purcell Nails  MD notified (1st page):  somner  Time of first page:  2013  MD notified (2nd page):  Time of second page:  Responding MD:  Janann Colonel  Time MD responded:  2048

## 2015-09-23 NOTE — Progress Notes (Signed)
MD aware of pt's status at this time; pt on 80mg  of levo, vasopressin and maxed out on EPI.  Will continue to monitor closely and update as needed.

## 2015-09-23 NOTE — Progress Notes (Signed)
EEG completed; results pending.    

## 2015-09-23 NOTE — H&P (Signed)
PULMONARY / CRITICAL CARE MEDICINE   Name: [name] MRN: [id number] DOB: December 13, 1965    ADMISSION DATE:  09/04/2015 CONSULTATION DATE:  09/23/15  REFERRING MD:  Burt Knack  CHIEF COMPLAINT:  Cardiac Arrest   HISTORY OF PRESENT ILLNESS:  Pt is encephelopathic; therefore, this HPI is obtained from chart review.  In addition, pt currently has two charts which are in the process of being merged. [name] is a 50 y.o. female with PMH of CAD s/p MI 2006, HLD, Dressler's syndrome, breast mass, cervical CA, anemia.  She was in her USOH until evening of 03/30 when she had witnessed cardiac arrest.  Prior to collapsing, she complained of weakness and SOB and her husband then heard her gurgling before finding her unresponsive.  After she collapsed, CPR was performed roughly 3 - 4 minutes later and on EMS arrival, pt was defibrillated x 4 and received 5mg  epinephrine.  She reportedly had roughly a 20 minute downtime.  She had inferoposterior STEMI and was subsequently taken to cath lab emergently where she had an occluded SVG - RCA vein graft re-vascularized.  In cath lab, she was intubated and PCCM was called for consideration of hypothermia protocol.  PAST MEDICAL HISTORY :  She  has a past medical history of Cancer ([place]); Cardiac arrest ([place]) (08/26/2015); Acute MI, inferoposterior wall, initial episode of care [facility]) (09/21/2015); and CAD (coronary artery disease), native coronary artery.  PAST SURGICAL HISTORY: She  has past surgical history that includes Aortic valve replacement (avr)/coronary artery bypass grafting (cabg).  Allergies not on file  No current facility-administered medications on file prior to encounter.   No current outpatient prescriptions on file prior to encounter.    FAMILY HISTORY:  Her has no family status information on file.   SOCIAL HISTORY: She    REVIEW OF SYSTEMS:  Unable to obtain as pt is encephalopathic.  SUBJECTIVE:  On vent,  unresponsive.  VITAL SIGNS: BP 148/128 mmHg  Pulse 118  Temp(Src) 96 F (35.6 C) (Temporal)  Resp 27  Ht 5\' 6"  (1.676 m)  Wt 50 kg (110 lb 3.7 oz)  BMI 17.80 kg/m2  SpO2 100%  HEMODYNAMICS:    VENTILATOR SETTINGS: Vent Mode:  [-] PRVC FiO2 (%):  [100 %] 100 % Set Rate:  [16 bmp] 16 bmp Vt Set:  [480 mL] 480 mL Plateau Pressure:  [16 cmH20] 16 cmH20  INTAKE / OUTPUT:     PHYSICAL EXAMINATION: General: Chronically ill appearing female, in NAD. Neuro: Sedated, non-responsive.  Appears to have intermittent myoclonic jerks. HEENT: [place]/AT. PERRL, sclerae anicteric. Cardiovascular: RRR, no M/R/G.  Lungs: Respirations even and unlabored.  Coarse bilaterally. Abdomen: BS x 4, soft, NT/ND.  Musculoskeletal: No gross deformities, no edema.  Skin: Intact, warm, no rashes.  LABS:  BMET  Recent Labs Lab 09/05/2015 2312  NA 137  K 3.3*  CL 98*  BUN 12  CREATININE 0.80  GLUCOSE 245*    Electrolytes No results for input(s): CALCIUM, MG, PHOS in the last 168 hours.  CBC  Recent Labs Lab 09/01/2015 2312  HGB 11.2*  HCT 33.0*    Coag's No results for input(s): APTT, INR in the last 168 hours.  Sepsis Markers  Recent Labs Lab 09/03/2015 2314  LATICACIDVEN 12.14*    ABG No results for input(s): PHART, PCO2ART, PO2ART in the last 168 hours.  Liver Enzymes No results for input(s): AST, ALT, ALKPHOS, BILITOT, ALBUMIN in the last 168 hours.  Cardiac Enzymes No results for input(s): TROPONINI,  PROBNP in the last 168 hours.  Glucose No results for input(s): GLUCAP in the last 168 hours.  Imaging No results found.   STUDIES:  CXR 03/31 > Echo 03/31 > EEG 03/31 >  CULTURES: None.  ANTIBIOTICS: None.  SIGNIFICANT EVENTS: 03/31 > admitted after out of hospital cardiac arrest.  LINES/TUBES: ETT 03/31 > CVL pending 03/31 > A line pending 03/31 >  DISCUSSION: 49 y.o. F with hx of prior MI in 2006, admitted 03/31 after out of hospital cardiac  arrest.  She was taken to cath lab emergently where she was intubated.  Following cath, she returned to ICU and was started on hypothermia protocol.  ASSESSMENT / PLAN:  CARDIOVASCULAR A:  Inferoposterior STEMI - s/p cath lab 03/31 where she had an occluded SVG - RCA vein graft re-vascularized. Hx CAD s/p MI 2006, dresslers syndrome, HLD. P:  Initiate hypothermia protocol. Goal MAP > 80 during hypothermia phase. Levophed as needed to maintain above goal. Follow CVP's. Assess echo. Cards following, appreciate the assistance. Trend troponin, lactate.  NEUROLOGIC A:   Acute metabolic encephalopathy with concern for anoxia - reportedly had ~20 min downtime. P:   Sedation:  Cisatracurium gtt / propofol gtt / fentanyl gtt. RASS goal: -5 during hypothermia phase. Hold WUA until off paralytics. Hold outpatient flexeril, norco, percocet. EEG. Consult neuro once re-warmed.  PULMONARY A: VDRF - due to inability to protect airway in the setting of cardiac arrest. P:   Full vent support. Wean as able. VAP prevention measures. Hold SBT until off of paralytics. Albuterol PRN. CXR in AM.  RENAL A:   Hypokalemia. Hypocalcemia. Anticipate multiple metabolic derangements during hypothermia protocol. P:   NS @ 100. 40 mEq K per tube. 1g Ca gluconate. BMP q2hrs x 4.  GASTROINTESTINAL A:   GI prophylaxis. Nutrition. Possible chronic mesenteric ischemia - seen in consultation by Dr. Bridgett Larsson 09/09/15.  Had CTA of the abdomen ordered which appears to have been done today 09/23/15. P:   SUP: Pantoprazole. NPO. F/u on CTA results. Day team to please consult vascular surgery in AM depending on CTA results.  HEMATOLOGIC / ONCOLOGIC A:   VTE Prophylaxis. Hx cervical CA - s/p chemoradiation Oct 2015 (followed by Dr. Sondra Come and Dr. Denman George). P:  SCD's / heparin. Coags now then repeat in 8 hours. CBC in AM.  INFECTIOUS A:   No indication of infection. P:   Monitor  clinically.  ENDOCRINE A:   Anticipate hyperglycemia during hypothermia phase.   P:   ICU hyperglycemia protocol.   Family updated: Multiple family members updated by Dr. Burt Knack.  Interdisciplinary Family Meeting v Palliative Care Meeting:  Due by: 09/29/15.  CC time: 45 minutes.   [name], [facility] Pager: [phone]  or [phone] 09/23/2015, 12:46 AM

## 2015-09-23 NOTE — Progress Notes (Signed)
   09/23/15 0000  Clinical Encounter Type  Visited With Family;Health care provider  Visit Type Initial;Code;Critical Care;ED  Referral From Nurse  Stress Factors  Family Stress Factors Health changes   Chaplain responded to a post-CPR turned Code STEMI in the ED. Chaplain assisted family with receiving medical consultation from the ED doctor, and transition to the Cath Lab waiting area. Chaplain offered support and prayer, and our services are available as needed.   Jeri Lager, Chaplain 09/23/2015 12:01 AM

## 2015-09-23 NOTE — Progress Notes (Signed)
MD aware, unable to keep BP map at 80 for [facility] protocol; Ok with keeping maps as high as possible on current gtts. Will continue to monitor closely and update as needed.

## 2015-09-23 NOTE — Progress Notes (Signed)
abg collected  

## 2015-09-23 NOTE — Progress Notes (Signed)
  Echocardiogram 2D Echocardiogram has been performed.  [name] 09/23/2015, 2:51 PM

## 2015-09-23 NOTE — Progress Notes (Addendum)
Progress note:  Admitted last night after  Cardiac arrest, STEMI s/p stent to occluded stent to RCA. On  Called to the bedside bedside for increasing hemodynamic instability. Patient is maxed out on levo, epi and vaso. Now starting neo.  ABG- 7.035/18/66/5.4  On examination BP 80/60, respiratory rate 20, unresponsive Pale, mottled skin. Tachycardia, no murmurs rubs gallops Clear lungs to auscultation Abdomen soft Extremities no edema  Plan: Given 2 amps of bicarb. Start Bicarb drip Labs pending including CBC, Chem 7, lactic A Continue pressor support CXR Follow up EEG report. Take off the hypothermia protocol.  Family updated at bedside. Told them that the prognosis is looking very poor. Reccommended that she be made DNR. They are waiting for the rest of the family members to arrive to make the decision.  Additional critical care time- 30 mins.  Marshell Garfinkel MD [facility] Pager [phone] If no answer or after 3pm call: [phone] 09/23/2015, 2:38 PM

## 2015-09-23 NOTE — Progress Notes (Signed)
Initial Nutrition Assessment  DOCUMENTATION CODES:   Severe malnutrition in context of chronic illness, Underweight  INTERVENTION:   Once pt is no longer hypothermic and hemodynamically stable recommend Vital AF 1.2 @ 15 ml/hr and increase by 10 ml every 12 hours to goal rate of 45 ml/hr.  Provides: 1296 kcal (100% of needs), 81 grams protein, and 875 ml H2O.   NUTRITION DIAGNOSIS:   Malnutrition related to chronic illness as evidenced by severe depletion of body fat, severe depletion of muscle mass.  GOAL:   Patient will meet greater than or equal to 90% of their needs  MONITOR:   Skin, I & O's, Vent status, Labs  REASON FOR ASSESSMENT:   Ventilator   ASSESSMENT:   Pt with hx of ? Cervical cancer, CABG in '02 admitted S/P witnessed cardiac arrest, 09/15/2015, 20 minutes before ROSC, shocked x 5, 2/2 occluded SVG-RCA which was revascularized and stented 3/30  Patient is currently intubated on ventilator support MV: 8.4 L/min Temp (24hrs), Avg:91 F (32.8 C), Min:85.8 F (29.9 C), Max:96.6 F (35.9 C) Currently on arctic sun protocol.  Labs reviewed: glucose 359-440 OG tube Nutrition-Focused physical exam completed. Findings are severe fat depletion, severe muscle depletion, and no edema.  MAP 67 pt has had episodes of hypotension. Pt is currently maxed out on levo, epi, and vaso, now starting neo.   Diet Order:    NPO  Skin:  Wound (see comment) (Stage I sacrum )  Last BM:  3/31  Height:   Ht Readings from Last 1 Encounters:  09/01/2015 5\' 6"  (1.676 m)    Weight:   Wt Readings from Last 1 Encounters:  09/23/15 101 lb 10.1 oz (46.1 kg)    Ideal Body Weight:  59 kg  BMI:  Body mass index is 16.41 kg/(m^2).  Estimated Nutritional Needs:   Kcal:  1279  Protein:  70-85 grams  Fluid:  > 1.5 L/day  EDUCATION NEEDS:   No education needs identified at this time  [place], [place], [place] Pager [phone] After Hours Pager

## 2015-09-23 NOTE — Progress Notes (Signed)
MD ordered to keep map >65.

## 2015-09-23 NOTE — Progress Notes (Signed)
[facility] Progress Note Patient Name: [name] DOB: 1965-11-01 MRN: [id number]   Date of Service  09/23/2015  HPI/Events of Note  Hypokalemia  eICU Interventions  Potassium replaced     Intervention Category Intermediate Interventions: Electrolyte abnormality - evaluation and management  DETERDING,ELIZABETH 09/23/2015, 4:10 AM

## 2015-09-23 NOTE — Progress Notes (Signed)
[facility] Progress Note Patient Name: [name] DOB: 08-Nov-1965 MRN: [id number]   Date of Service  09/23/2015  HPI/Events of Note  Hyperglycemia.  eICU Interventions  Placed on q4 hour SSI - moderate scale May require an insulin gtt     Intervention Category Intermediate Interventions: Hyperglycemia - evaluation and treatment  DETERDING,[name] 09/23/2015, 5:37 AM

## 2015-09-23 NOTE — Progress Notes (Signed)
CRITICAL VALUE ALERT  Critical value received:  Lactic acid 11.9  Date of notification:  09/23/15  Time of notification:  [id number]  Critical value read back:Yes.    Nurse who received alert:  n Neema Fluegge rn  MD notified (1st page):  MD at bedside   Time of first page:  [phone]  MD notified (2nd page):  Time of second page:  Responding MD:  MD Mannum at bedside  Time MD responded:  1428

## 2015-09-23 NOTE — Progress Notes (Signed)
   09/23/15 1500  Clinical Encounter Type  Visited With Family  Visit Type Patient actively dying  Referral From [place] Needs Prayer  Stress Factors  Family Stress Factors Loss  At request of senior chaplain, checked on family that came in last night. Doctors were just informing family that they thought efforts to keep her alive were not working. Family requested time to think. Son seemed most distressed. Chaplain said prayer with them, but family's pastor was in hall, so left family in their care.

## 2015-09-23 NOTE — Progress Notes (Signed)
[facility] Progress Note Patient Name: ONNA NAHAR DOB: Apr 26, 1966 MRN: [id number]   Date of Service  09/23/2015  HPI/Events of Note  Hypomag  eICU Interventions  Mag replaced     Intervention Category Intermediate Interventions: Electrolyte abnormality - evaluation and management  Shameika Speelman 09/23/2015, 5:38 AM

## 2015-09-23 NOTE — Progress Notes (Signed)
MD ordered to start rewarming at 1430. Rewarmed started with 2 RNs at bedside/machine.

## 2015-09-23 NOTE — Progress Notes (Addendum)
CRITICAL VALUE ALERT  Critical value received:  Calcium 6.3  Date of notification:  09/23/15  Time of notification:  [id number]  Critical value read back:Yes.    Nurse who received alert:  n Ysabel Stankovich rn  MD notified (1st page):  sumner  Time of first page:  1727  MD notified (2nd page):  Time of second page:  Responding MD:  Summer Time MD responded:  [phone]

## 2015-09-23 NOTE — Progress Notes (Signed)
2 amps bicarb given per MD order; orders for bicarb gtt received; will continue to monitor closely and update as needed.

## 2015-09-24 DIAGNOSIS — R69 Illness, unspecified: Secondary | ICD-10-CM

## 2015-09-24 DIAGNOSIS — R570 Cardiogenic shock: Secondary | ICD-10-CM

## 2015-09-24 DIAGNOSIS — I469 Cardiac arrest, cause unspecified: Secondary | ICD-10-CM

## 2015-09-24 LAB — CBC WITH DIFFERENTIAL/PLATELET
BASOS PCT: 0 %
Basophils Absolute: 0 10*3/uL (ref 0.0–0.1)
EOS PCT: 0 %
Eosinophils Absolute: 0 10*3/uL (ref 0.0–0.7)
HCT: 32.7 % — ABNORMAL LOW (ref 36.0–46.0)
HEMOGLOBIN: 11.4 g/dL — AB (ref 12.0–15.0)
LYMPHS PCT: 13 %
Lymphs Abs: 1.2 10*3/uL (ref 0.7–4.0)
MCH: 30.2 pg (ref 26.0–34.0)
MCHC: 34.9 g/dL (ref 30.0–36.0)
MCV: 86.5 fL (ref 78.0–100.0)
MONO ABS: 0.5 10*3/uL (ref 0.1–1.0)
Monocytes Relative: 6 %
NEUTROS PCT: 81 %
Neutro Abs: 7.2 10*3/uL (ref 1.7–7.7)
Platelets: 121 10*3/uL — ABNORMAL LOW (ref 150–400)
RBC: 3.78 MIL/uL — ABNORMAL LOW (ref 3.87–5.11)
RDW: 14.6 % (ref 11.5–15.5)
WBC: 8.8 10*3/uL (ref 4.0–10.5)

## 2015-09-24 LAB — GLUCOSE, CAPILLARY
GLUCOSE-CAPILLARY: 483 mg/dL — AB (ref 65–99)
GLUCOSE-CAPILLARY: 385 mg/dL — AB (ref 65–99)
GLUCOSE-CAPILLARY: 355 mg/dL — AB (ref 65–99)
GLUCOSE-CAPILLARY: 305 mg/dL — AB (ref 65–99)
GLUCOSE-CAPILLARY: 243 mg/dL — AB (ref 65–99)
GLUCOSE-CAPILLARY: 184 mg/dL — AB (ref 65–99)
GLUCOSE-CAPILLARY: 181 mg/dL — AB (ref 65–99)
GLUCOSE-CAPILLARY: 141 mg/dL — AB (ref 65–99)
GLUCOSE-CAPILLARY: 112 mg/dL — AB (ref 65–99)
Glucose-Capillary: 327 mg/dL — ABNORMAL HIGH (ref 65–99)
Glucose-Capillary: 389 mg/dL — ABNORMAL HIGH (ref 65–99)
Glucose-Capillary: 445 mg/dL — ABNORMAL HIGH (ref 65–99)
Glucose-Capillary: 484 mg/dL — ABNORMAL HIGH (ref 65–99)
Glucose-Capillary: 460 mg/dL — ABNORMAL HIGH (ref 65–99)
Glucose-Capillary: 282 mg/dL — ABNORMAL HIGH (ref 65–99)
Glucose-Capillary: 340 mg/dL — ABNORMAL HIGH (ref 65–99)
Glucose-Capillary: 101 mg/dL — ABNORMAL HIGH (ref 65–99)
Glucose-Capillary: 211 mg/dL — ABNORMAL HIGH (ref 65–99)

## 2015-09-24 LAB — BASIC METABOLIC PANEL
ANION GAP: 19 — AB (ref 5–15)
ANION GAP: 21 — AB (ref 5–15)
BUN: 15 mg/dL (ref 6–20)
BUN: 15 mg/dL (ref 6–20)
CALCIUM: 6.3 mg/dL — AB (ref 8.9–10.3)
CALCIUM: 6.3 mg/dL — AB (ref 8.9–10.3)
CHLORIDE: 92 mmol/L — AB (ref 101–111)
CO2: 21 mmol/L — ABNORMAL LOW (ref 22–32)
CO2: 20 mmol/L — ABNORMAL LOW (ref 22–32)
CREATININE: 1.73 mg/dL — AB (ref 0.44–1.00)
CREATININE: 1.92 mg/dL — AB (ref 0.44–1.00)
Chloride: 88 mmol/L — ABNORMAL LOW (ref 101–111)
GFR calc non Af Amer: 33 mL/min — ABNORMAL LOW (ref >60)
GFR, EST AFRICAN AMERICAN: 38 mL/min — AB (ref >60)
GFR, EST AFRICAN AMERICAN: 34 mL/min — AB (ref >60)
GFR, EST NON AFRICAN AMERICAN: 29 mL/min — AB (ref >60)
Glucose, Bld: 414 mg/dL — ABNORMAL HIGH (ref 65–99)
Glucose, Bld: 221 mg/dL — ABNORMAL HIGH (ref 65–99)
Potassium: 3.7 mmol/L (ref 3.5–5.1)
Potassium: 3.7 mmol/L (ref 3.5–5.1)
SODIUM: 129 mmol/L — AB (ref 135–145)
Sodium: 132 mmol/L — ABNORMAL LOW (ref 135–145)

## 2015-09-24 LAB — POCT I-STAT 3, ART BLOOD GAS (G3+)
ACID-BASE EXCESS: 2 mmol/L (ref 0.0–2.0)
Acid-Base Excess: 4 mmol/L — ABNORMAL HIGH (ref 0.0–2.0)
BICARBONATE: 26.2 meq/L — AB (ref 20.0–24.0)
BICARBONATE: 27.8 meq/L — AB (ref 20.0–24.0)
O2 SAT: 96 %
O2 Saturation: 97 %
PCO2 ART: 35.3 mmHg (ref 35.0–45.0)
PCO2 ART: 36.2 mmHg (ref 35.0–45.0)
PH ART: 7.492 — AB (ref 7.350–7.450)
PO2 ART: 71 mmHg — AB (ref 80.0–100.0)
PO2 ART: 80 mmHg (ref 80.0–100.0)
Patient temperature: 36.4
TCO2: 27 mmol/L (ref 0–100)
TCO2: 29 mmol/L (ref 0–100)
pH, Arterial: 7.476 — ABNORMAL HIGH (ref 7.350–7.450)

## 2015-09-24 LAB — HEMOGLOBIN A1C
HEMOGLOBIN A1C: 5.4 % (ref 4.8–5.6)
Mean Plasma Glucose: 108 mg/dL

## 2015-09-24 LAB — CBC
HCT: 18.4 % — ABNORMAL LOW (ref 36.0–46.0)
HEMOGLOBIN: 6.1 g/dL — AB (ref 12.0–15.0)
MCH: 29.6 pg (ref 26.0–34.0)
MCHC: 33.2 g/dL (ref 30.0–36.0)
MCV: 89.3 fL (ref 78.0–100.0)
Platelets: 210 10*3/uL (ref 150–400)
RBC: 2.06 MIL/uL — ABNORMAL LOW (ref 3.87–5.11)
RDW: 14.8 % (ref 11.5–15.5)
WBC: 15.3 10*3/uL — ABNORMAL HIGH (ref 4.0–10.5)

## 2015-09-24 LAB — HEPATIC FUNCTION PANEL
ALT: 1350 U/L — ABNORMAL HIGH (ref 14–54)
AST: 8184 U/L — ABNORMAL HIGH (ref 15–41)
Alkaline Phosphatase: 114 U/L (ref 38–126)
BILIRUBIN DIRECT: 0.6 mg/dL — AB (ref 0.1–0.5)
BILIRUBIN TOTAL: 1.1 mg/dL (ref 0.3–1.2)
Indirect Bilirubin: 0.5 mg/dL (ref 0.3–0.9)
Total Protein: <3 g/dL — ABNORMAL LOW (ref 6.5–8.1)

## 2015-09-24 LAB — ABO/RH: ABO/RH(D): O POS

## 2015-09-24 LAB — TROPONIN I: Troponin I: >65 ng/mL (ref <0.031)

## 2015-09-24 LAB — PREPARE RBC (CROSSMATCH)

## 2015-09-24 MED ORDER — FENTANYL CITRATE (PF) 100 MCG/2ML IJ SOLN
INTRAMUSCULAR | Status: AC
  Administered 2015-09-24: 100 ug
  Filled 2015-09-24: qty 2

## 2015-09-24 MED ORDER — SODIUM CHLORIDE 0.9% FLUSH: 10.0000 mL | INTRAVENOUS | Status: DC | PRN

## 2015-09-24 MED ORDER — SODIUM CHLORIDE 0.9% FLUSH
10.0000 mL | Freq: Two times a day (BID) | INTRAVENOUS | Status: DC
Start: 2015-09-24 — End: 2015-09-24

## 2015-09-24 MED ORDER — SODIUM CHLORIDE 0.9 % IV SOLN
Freq: Once | INTRAVENOUS | Status: AC
  Administered 2015-09-24: 08:00:00 via INTRAVENOUS

## 2015-09-24 MED ORDER — SODIUM CHLORIDE 0.9 % IV BOLUS (SEPSIS)
1000.0000 mL | Freq: Once | INTRAVENOUS | Status: AC
  Administered 2015-09-24: 1000 mL via INTRAVENOUS

## 2015-09-24 DEATH — deceased

## 2015-09-25 LAB — TYPE AND SCREEN
ABO/RH(D): O POS
ANTIBODY SCREEN: NEGATIVE
UNIT DIVISION: 0
UNIT DIVISION: 0

## 2015-09-25 LAB — CALCIUM, IONIZED: Calcium, Ionized, Serum: 3.2 mg/dL — ABNORMAL LOW (ref 4.5–5.6)

## 2015-09-27 ENCOUNTER — Encounter: Payer: Self-pay | Admitting: Vascular Surgery

## 2015-10-03 ENCOUNTER — Telehealth: Payer: Self-pay

## 2015-10-03 NOTE — Telephone Encounter (Signed)
On 10-07-2015 I received a death certificate from [facility] (orginal). The death certificate is for burial. The patient is a patient of Doctor Nelda Marseille. The death certificate will be taken to [facility]) this am for signature. On 2015-10-07 I received the death certificate back from Doctor Nelda Marseille. I got the death certificate ready and mailed the death certificate to the [facility] Dept per their request.

## 2015-10-24 NOTE — Progress Notes (Signed)
[facility] Physician-Brief Progress Note Patient Name: [name] DOB: October 10, 1965 MRN: [id number]   Date of Service  Oct 06, 2015  HPI/Events of Note  Hgb down from 8.0 to 6.1 in the setting of cardiac arrest  eICU Interventions  Plan: Transfuse 2 units pRBC Post-transfusion CBC Consider d/c of subq heparin     Intervention Category Intermediate Interventions: Other:  DETERDING,ELIZABETH 06-Oct-2015, 5:56 AM

## 2015-10-24 NOTE — Progress Notes (Signed)
SUBJECTIVE:Sedated, intubated. On very high-dose pressors.   EEG with severe anoxic brain injury  Tele: sinus with runs of NSVT.   BP 128/83 mmHg  Pulse 115  Temp(Src) 97.2 F (36.2 C) (Core (Comment))  Resp 27  Ht '5\' 6"'$  (1.676 m)  Wt 46.1 kg (101 lb 10.1 oz)  BMI 16.41 kg/m2  SpO2 98%  LMP   Intake/Output Summary (Last 24 hours) at 10/06/15 1035 Last data filed at 06-Oct-2015 1010  Gross per 24 hour  Intake 8735.42 ml  Output    207 ml  Net 8528.42 ml    PHYSICAL EXAM General: Sedated, intubated.  Psych:  Sedated Neck:No masses noted.  Lungs: Mechanical BS bilaterally with no wheezes or rhonci noted.  Heart: tachy regular. Distant  Abdomen: hypoactive Bowel sounds are present.   Extremities: cool diffuse mottling  LABS: Basic Metabolic Panel:  Recent Labs  09/23/15 0420  Oct 06, 2015 0332 2015/10/06 0936  NA 138  < > 132* 129*  K 2.3*  < > 3.7 3.7  CL 106  < > 92* 88*  CO2 18*  < > 21* 20*  GLUCOSE 294*  < > 414* 221*  BUN 14  < > 15 15  CREATININE 1.12*  < > 1.73* 1.92*  CALCIUM 7.3*  < > 6.3* 6.3*  MG 1.6*  --   --   --   PHOS 5.8*  --   --   --   < > = values in this interval not displayed. CBC:  Recent Labs  09/23/15 1416 10-06-15 0449  WBC 13.6* 15.3*  HGB 8.0* 6.1*  HCT 26.4* 18.4*  MCV 94.6 89.3  PLT 389 210   Cardiac Enzymes:  Recent Labs  09/23/15 2305 October 06, 2015 0331 10-06-2015 0936  TROPONINI >65.00* >65.00* >65.00*   Current Meds: . antiseptic oral rinse  7 mL Mouth Rinse 10 times per day  . aspirin  81 mg Oral Daily  . atorvastatin  80 mg Oral q1800  . chlorhexidine gluconate (SAGE KIT)  15 mL Mouth Rinse BID  . heparin  5,000 Units Subcutaneous 3 times per day  . pantoprazole (PROTONIX) IV  40 mg Intravenous QHS  . sodium bicarbonate  100 mEq Intravenous Once  . sodium chloride flush  10-40 mL Intracatheter Q12H  . ticagrelor  90 mg Oral BID   Cath 09/18/2015:  Prox LAD lesion, 100% stenosed.  LIMA .  Ost Ramus to Ramus  lesion, 40% stenosed.  Prox Cx lesion, 25% stenosed.  Prox RCA lesion, 100% stenosed.  SVG .  Prox Graft lesion, 100% stenosed. Post intervention, there is a 0% residual stenosis.  There is moderate left ventricular systolic dysfunction.  1. Severe 2 vessel CAD with total occlusion of the RCA and proximal LAD 2. Patent LIMA - LAD 3. Total occlusion of the SVG-RCA treated successfully with Primary PCI (DES). Pt loaded with Brilinta 180 mg in cath lab   ASSESSMENT AND PLAN:  1. Out-of-hospital VF arrest/CAD s/p CABG/acute inferoposterior STEMI:   2. Atrial fibrillation, paroxysmal:  3. Cardiogenic shock 4. Anoxic brain injury 5. Shock liver 6. AKI 7. Bowel ischemia  She is actively dying from shock. She is on max dose pressors. EEG shows severe anoxic brain injury. Discussed extensively with family and CCM. Suspect she will pass today. Son understands situation but does not want to make DNR at this point. I stressed to him that she cannot recover from this. His uncle is coming from [place] this afternoon and will  rediscuss switch to comfort care.  The patient is critically ill with multiple organ systems failure and requires high complexity decision making for assessment and support, frequent evaluation and titration of therapies, application of advanced monitoring technologies and extensive interpretation of multiple databases.   Critical Care Time devoted to patient care services described in this note is 35 Minutes.    Cleon Thoma  10/09/1708:35 AM

## 2015-10-24 NOTE — Consult Note (Signed)
Ch called by RN as pt actively dying; [place] briefly met and introduced spiritual care for family; family waiting others to arrive and [place] is available if family would like support and prayer at that time. Page as needed [phone]. Gwynn Burly 12:06 PM

## 2015-10-24 NOTE — Progress Notes (Signed)
Pt blood pressure dropped around 10pm last night requiring increase need of vasopressors. Since then core temperature started to decline. Pt warmed up to 36.1 before starting to drop again. Currently core temp is 34.1. Md Timblle notified. Order given for warm temp

## 2015-10-24 NOTE — Progress Notes (Signed)
CRITICAL VALUE ALERT  Critical value received:  Trop >65, Calcium 6.3  Date of notification:  10-24-15  Time of notification:  [id number]  Critical value read back:Yes.    Nurse who received alert:  JSherri Sear  Dr. Nelda Marseille at bedside and values reported

## 2015-10-24 NOTE — Discharge Summary (Signed)
NAMEJHOURNI, CARLE              ACCOUNT NO.:  [id number]  MEDICAL RECORD NO.:  [id number]  LOCATION:  [id number]                        FACILITY:  [facility]  PHYSICIAN:  Providence Lanius, MD  DATE OF BIRTH:  Aug 27, 1965  DATE OF ADMISSION:  09/03/2015 DATE OF DISCHARGE:  2015-10-16                              DISCHARGE SUMMARY   FINAL DIAGNOSIS/CAUSE OF DEATH:  Pulseless electric activity cardiac arrest.  SECONDARY DIAGNOSES: 1. ST-segment elevation myocardial infarction. 2. Acute respiratory failure. 3. Cardiogenic shock. 4. Anoxic encephalopathy. 5. Metabolic encephalopathy. 6. Hypokalemia. 7. Hypercalcemia. 8. Questionable mesenteric ischemia.  The patient is a 50 year old female with a distant past medical history, who presented to the hospital with ___________ chest pain and had a 20 minute cardiac arrest with CPR after spontaneous circulation.  After 20 minutes, the patient was taken to the cath lab with diagnosis of traces of ST-segment elevation MI.  Cath was performed with interventions as in the chart and patient was brought back to the Coronary Care unit where she was found to be in refractory shock.  There was significant mesenteric ischemia due to the abdominal exam finding with refractory shock.  I had a conversation with her son, who insisted full aggressive resuscitation is to take place, which she was undergoing at that time. There were no further ability to increase the dose of pressors any further at this point, the patient suffered another cardiac arrest during CPR and resuscitative effort the son requested that we cease resuscitative efforts and patient was declared dead at that time.     Providence Lanius, MD     WJY/MEDQ  D:  09/27/2015  T:  09/28/2015  Job:  [id number]

## 2015-10-24 NOTE — Progress Notes (Signed)
Arterial sheath in place from heart catheterization. No blood return noted and unable to transduce for arterial blood pressure. Sheath removed per protocol. Manual pressure held x20 mins and pressure dressing placed on site. Level 0 prior to and post pull of sheath. VSS.

## 2015-10-24 NOTE — Progress Notes (Signed)
1443: Pt asystole on monitor, family at bedside, RN into room, no pulse found, Code Blue called and CPR initiated. Son at bedside and states "You don't have to do that." RN asks son, Rachel Bo, to clarify, stating "you do not want [place] to do CPR?", he states "No and do not do anything else."   Code blue cancelled. Family brought to patient bedside. Pt continues to agonally breathe, although asystole on monitor. RN asks son if he would like patient to get pain medication. He states "yes". Fentanyl given to patient for comfort. RT was asked to extubate patient as well.  Emotional Support provided and chaplin at bedside.

## 2015-10-24 NOTE — Progress Notes (Signed)
RT removed patient off of the vent per son/family's request. Patient had been asystolic for a few minutes prior to sons request.

## 2015-10-24 NOTE — Progress Notes (Signed)
CRITICAL VALUE ALERT  Critical value received:  hgb 6.2  Date of notification:  10-07-15  Time of notification:  0550  Critical value read back:yes  Nurse who received alert:  Purcell Nails  MD notified (1st page): CCMD Time of first page:  0550 MD notified (2nd page):  Time of second page:  Responding MD: Dr Jimmy Footman  Time MD responded:  [phone]

## 2015-10-24 NOTE — Progress Notes (Signed)
214mL of Fentanyl gtt wasted in sink, witnessed x2 RNs (Rudi Heap, RN and Delbert Phenix, RN)

## 2015-10-24 NOTE — Progress Notes (Signed)
EKG CRITICAL VALUE     12 lead EKG performed.  Critical value noted.  Earnest Bailey, RN notified.   Yehuda Mao, [place] 09/29/15 9:49 AM

## 2015-10-24 NOTE — Progress Notes (Signed)
PULMONARY / CRITICAL CARE MEDICINE   Name: [name] MRN: [id number] DOB: 03-May-1966    ADMISSION DATE:  09/02/2015 CONSULTATION DATE:  09/23/15  REFERRING MD:  Burt Knack  CHIEF COMPLAINT:  Cardiac Arrest   HISTORY OF PRESENT ILLNESS:  Pt is encephelopathic; therefore, this HPI is obtained from chart review.  In addition, pt currently has two charts which are in the process of being merged. [name] is a 50 y.o. female with PMH of CAD s/p MI 2006, HLD, Dressler's syndrome, breast mass, cervical CA, anemia.  She was in her USOH until evening of 03/30 when she had witnessed cardiac arrest.  Prior to collapsing, she complained of weakness and SOB and her husband then heard her gurgling before finding her unresponsive.  After she collapsed, CPR was performed roughly 3 - 4 minutes later and on EMS arrival, pt was defibrillated x 4 and received 5mg  epinephrine.  She reportedly had roughly a 20 minute downtime.  She had inferoposterior STEMI and was subsequently taken to cath lab emergently where she had an occluded SVG - RCA vein graft re-vascularized.  In cath lab, she was intubated and PCCM was called for consideration of hypothermia protocol.  SUBJECTIVE:  On vent, unresponsive.  VITAL SIGNS: BP 120/107 mmHg  Pulse 125  Temp(Src) 97.3 F (36.3 C) (Core (Comment))  Resp 28  Ht 5\' 6"  (1.676 m)  Wt 46.1 kg (101 lb 10.1 oz)  BMI 16.41 kg/m2  SpO2 96%  LMP   HEMODYNAMICS: CVP:  [13 mmHg-16 mmHg] 16 mmHg  VENTILATOR SETTINGS: Vent Mode:  [-] PRVC FiO2 (%):  [40 %-50 %] 50 % Set Rate:  [18 bmp] 18 bmp Vt Set:  [480 mL] 480 mL PEEP:  [5 cmH20] 5 cmH20 Plateau Pressure:  [16 cmH20-18 cmH20] 18 cmH20  INTAKE / OUTPUT: I/O last 3 completed shifts: In: 8699.9 [I.V.:8239.9; IV Piggyback:460] Out: 477 [Urine:477]  PHYSICAL EXAMINATION: General: Chronically ill appearing female, in NAD.  Mottled from foot to waist. Neuro: Non-responsive.  Appears to have intermittent  myoclonic jerks. HEENT: [place]/AT. PERRL, sclerae anicteric. Cardiovascular: RRR, no M/R/G.  Lungs: Respirations even and unlabored.  Coarse bilaterally. Abdomen: BS x 4, soft, NT/ND.  Musculoskeletal: No gross deformities, no edema.  Skin: Intact, warm, no rashes.  LABS:  BMET  Recent Labs Lab 09/23/15 1852 09/23/15 2305 2015-10-07 0332  NA 134* 136 132*  K 3.6 3.2* 3.7  CL 98* 93* 92*  CO2 11* 22 21*  BUN 17 15 15   CREATININE 1.72* 1.79* 1.73*  GLUCOSE 450* 477* 414*   Electrolytes  Recent Labs Lab 09/23/15 0420  09/23/15 1852 09/23/15 2305 2015/10/07 0332  CALCIUM 7.3*  < > 6.1* 7.1* 6.3*  MG 1.6*  --   --   --   --   PHOS 5.8*  --   --   --   --   < > = values in this interval not displayed.  CBC  Recent Labs Lab 09/23/15 0420 09/23/15 0608 09/23/15 1416 October 07, 2015 0449  WBC 14.1*  --  13.6* 15.3*  HGB 8.2* 9.5* 8.0* 6.1*  HCT 26.0* 28.0* 26.4* 18.4*  PLT 294  --  389 210   Coag's  Recent Labs Lab 09/23/15 0420 09/23/15 1418  APTT >200* 56*  INR 1.79* 2.01*   Sepsis Markers  Recent Labs Lab 09/23/15 0846 09/23/15 1428 09/23/15 1746  LATICACIDVEN 4.2* 11.9* 16.03*   ABG  Recent Labs Lab 09/23/15 1755 09/23/15 2149 09/23/15 2158  PHART 7.218* 7.492*  7.476*  PCO2ART 25.0* 36.2 35.3  PO2ART 76.2* 80.0 71.0*   Liver Enzymes  Recent Labs Lab 10-14-2015 0332  AST 8184*  ALT 1350*  ALKPHOS 114  BILITOT 1.1  ALBUMIN <1.0*   Cardiac Enzymes  Recent Labs Lab 09/23/15 1852 09/23/15 2305 10/14/15 0331  TROPONINI >65.00* >65.00* >65.00*   Glucose  Recent Labs Lab Oct 14, 2015 0210 10/14/15 0317 2015/10/14 0423 10-14-2015 0527 2015/10/14 0611 14-Oct-2015 0718  GLUCAP 460* 385* 355* 340* 305* 282*   Imaging Dg Chest Port 1 View  09/23/2015  CLINICAL DATA:  Hypoxia EXAM: PORTABLE CHEST 1 VIEW COMPARISON:  Study obtained earlier in the day FINDINGS: Endotracheal tube tip is 2.1 cm above carina. Central catheter tip is in the superior vena  cava. Nasogastric tube tip and side port are below the diaphragm. There is no demonstrable pneumothorax. There is no edema or consolidation. Heart size and pulmonary vascularity are normal. No adenopathy. IMPRESSION: Tube and catheter positions as described without pneumothorax. No apparent edema or consolidation. Electronically Signed   By: Lowella Grip III M.D.   On: 09/23/2015 14:26   STUDIES:  CXR 03/31 > Echo 03/31 > EEG 03/31 >diffuse slowness with evidence of anoxic injury  CULTURES: None.  ANTIBIOTICS: None.  SIGNIFICANT EVENTS: 03/31 > admitted after out of hospital cardiac arrest.  LINES/TUBES: ETT 03/31 > CVL pending 03/31 > A line pending 03/31 >  DISCUSSION: 50 y.o. F with hx of prior MI in 2006, admitted 03/31 after out of hospital cardiac arrest.  She was taken to cath lab emergently where she was intubated.  Following cath, she returned to ICU and was started on hypothermia protocol.  ASSESSMENT / PLAN:  CARDIOVASCULAR A:  Inferoposterior STEMI - s/p cath lab 03/31 where she had an occluded SVG - RCA vein graft re-vascularized. Hx CAD s/p MI 2006, dresslers syndrome, HLD. P:  Continue hypothermia protocol. Goal MAP > 80 during hypothermia phase. On Levophed 40, epi 18, neo 100 and vaso 0.03, further increase in pressors is futile and will not be attempted. Follow CVP's. Echo per cards. Cards following, appreciate the assistance. Trend troponin, lactate.  NEUROLOGIC A:   Acute metabolic encephalopathy with concern for anoxia - reportedly had ~20 min downtime. P:   Sedation:  Cisatracurium gtt / propofol gtt / fentanyl gtt. RASS goal: -5 during hypothermia phase. Hold WUA until off paralytics. Hold outpatient flexeril, norco, percocet. EEG noted as above. Consult neuro once re-warmed.  PULMONARY A: VDRF - due to inability to protect airway in the setting of cardiac arrest. P:   Full vent support. Wean as able. VAP prevention measures. Hold  SBT until off of paralytics. Albuterol PRN. CXR in AM.  RENAL A:   Hypokalemia. Hypocalcemia. Anticipate multiple metabolic derangements during hypothermia protocol. P:   NS @ 100. Replace electrolytes as indicated. 1g Ca gluconate. BMP q2hrs x 4.  GASTROINTESTINAL A:   GI prophylaxis. Nutrition. Possible chronic mesenteric ischemia - seen in consultation by Dr. Bridgett Larsson 09/09/15.  Had CTA of the abdomen ordered which appears to have been done today 09/23/15. P:   SUP: Pantoprazole. NPO. F/u on CTA results. Likely ischemic injury to the bowel, however, patient is so severely hemodynamically unstable on 4 pressors maxed for the most part with poor MAP, an OR visit at this time is futile.  HEMATOLOGIC / ONCOLOGIC A:   VTE Prophylaxis. Hx cervical CA - s/p chemoradiation Oct 2015 (followed by Dr. Sondra Come and Dr. Denman George). P:  SCD's / heparin. Coags now then  repeat in 8 hours. CBC in AM.  INFECTIOUS A:   No indication of infection. P:   Monitor clinically.  ENDOCRINE A:   Anticipate hyperglycemia during hypothermia phase.   P:   ICU hyperglycemia protocol.  Family updated: Spoke with son and daughter in law, informed in no uncertain manner that patient will be unlikely to survive today and to gather family.  Son insists on full code status but understands that she will not survive.  They were also informed of EEG with significant brain damage.  No further increase in pressors as this is futile.  Will not transfuse any further at this point either (got a unit for Hg of 6.1).  Will continue care as is for now.  RN to contact PCCM or cards prior to cardiac arrest and will attempt to speak with the family again.  Interdisciplinary Family Meeting v Palliative Care Meeting:  Due by: 09/29/15.  The patient is critically ill with multiple organ systems failure and requires high complexity decision making for assessment and support, frequent evaluation and titration of therapies,  application of advanced monitoring technologies and extensive interpretation of multiple databases.   Critical Care Time devoted to patient care services described in this note is  35  Minutes. This time reflects time of care of this signee Dr Jennet Maduro. This critical care time does not reflect procedure time, or teaching time or supervisory time of PA/NP/Med student/Med Resident etc but could involve care discussion time.  Rush Farmer, M.D. [facility] Pulmonary/Critical Care Medicine. Pager: [phone]. After hours pager: [phone].  23-Oct-2015, 10:24 AM

## 2015-10-24 NOTE — Progress Notes (Signed)
Pt asystole on monitor, no heart nor lung sounds auscultated x2 RN Delbert Phenix, RN and L. Hout, RN) x1 minute. Family at bedside. Emotional Support provided. Attending MD made aware. Time of Death: 29

## 2015-10-24 NOTE — Procedures (Signed)
History: [name] is an 50 y.o. female patient status post cardiac arrest, with acute encephalopathy . On sedative medications including fentanyl gtt @ 150 mcg /hr and propofol gtt 20 mcg/kg/min at the time of EEG. Routine inpatient EEG was performed for further evaluation and prognostication.   Patient Active Problem List   Diagnosis Date Noted  . Acute inferoposterior myocardial infarction ([place]) 09/23/2015  . Pressure ulcer 09/23/2015  . Hypokalemia   . Hypocalcemia   . Anoxia   . Anoxic encephalopathy ([place])   . Acute respiratory failure with hypoxia ([place])   . Encounter for central line placement   . Cardiac arrest ([place]) 08/31/2015  . Acute MI, inferoposterior wall, initial episode of care ([place]) 09/15/2015     Current facility-administered medications:  .  0.9 %  sodium chloride infusion, , Intravenous, Once, Colbert Coyer, MD .  acetaminophen (TYLENOL) tablet 650 mg, 650 mg, Oral, Q4H PRN, Sherren Mocha, MD .  albuterol (PROVENTIL) (2.5 MG/3ML) 0.083% nebulizer solution 2.5 mg, 2.5 mg, Nebulization, Q3H PRN, Rahul P Desai, PA-C .  amiodarone (NEXTERONE PREMIX) 360 MG/200ML (1.8 mg/mL) IV infusion, 30 mg/hr, Intravenous, Continuous, Burnell Blanks, MD, Last Rate: 16.7 mL/hr at 2015/10/21 0129, 30 mg/hr at 2015-10-21 0129 .  antiseptic oral rinse solution (CORINZ), 7 mL, Mouth Rinse, 10 times per day, Sherren Mocha, MD, 7 mL at October 21, 2015 0600 .  aspirin chewable tablet 81 mg, 81 mg, Oral, Daily, Sherren Mocha, MD, 81 mg at 09/23/15 1000 .  atorvastatin (LIPITOR) tablet 80 mg, 80 mg, Oral, q1800, Sherren Mocha, MD, 80 mg at 09/23/15 1754 .  chlorhexidine gluconate (SAGE KIT) (PERIDEX) 0.12 % solution 15 mL, 15 mL, Mouth Rinse, BID, Sherren Mocha, MD, 15 mL at 09/23/15 1939 .  EPINEPHrine (ADRENALIN) 4 mg in dextrose 5 % 250 mL (0.016 mg/mL) infusion, 0.5-20 mcg/min, Intravenous, Titrated, Jose Angelo A Corrie Dandy, MD, Last Rate: 71.3 mL/hr at 2015-10-21 0532, 19 mcg/min  at 21-Oct-2015 0532 .  fentaNYL (SUBLIMAZE) 2,500 mcg in sodium chloride 0.9 % 250 mL (10 mcg/mL) infusion, 25-400 mcg/hr, Intravenous, Continuous, Rahul P Desai, PA-C, Stopped at 21-Oct-2015 0021 .  fentaNYL (SUBLIMAZE) bolus via infusion 50 mcg, 50 mcg, Intravenous, Q30 min PRN, Rahul P Desai, PA-C .  heparin injection 5,000 Units, 5,000 Units, Subcutaneous, 3 times per day, Sherren Mocha, MD, 5,000 Units at 10/21/15 [phone] .  insulin regular (NOVOLIN R,HUMULIN R) 250 Units in sodium chloride 0.9 % 250 mL (1 Units/mL) infusion, , Intravenous, Continuous, Jose Angelo A de [place], MD, Last Rate: 19.6 mL/hr at 10-21-15 0615, 19.6 Units/hr at 10/21/15 0615 .  midazolam (VERSED) 50 mg in sodium chloride 0.9 % 50 mL (1 mg/mL) infusion, 0-10 mg/hr, Intravenous, Continuous, [place], MD, Stopped at 10-21-2015 0000 .  midazolam (VERSED) bolus via infusion 1-2 mg, 1-2 mg, Intravenous, Q2H PRN, [place], MD .  midazolam (VERSED) injection 2 mg, 2 mg, Intravenous, Q15 min PRN, Jose Angelo A Corrie Dandy, MD .  midazolam (VERSED) injection 2 mg, 2 mg, Intravenous, Q2H PRN, Jose Angelo A Corrie Dandy, MD .  nitroGLYCERIN (NITROSTAT) SL tablet 0.4 mg, 0.4 mg, Sublingual, Q5 Min x 3 PRN, Sherren Mocha, MD .  norepinephrine (LEVOPHED) 16 mg in dextrose 5 % 250 mL (0.064 mg/mL) infusion, 0-100 mcg/min, Intravenous, Titrated, Sherren Mocha, MD, Last Rate: 46.9 mL/hr at 21-Oct-2015 0321, 50 mcg/min at 10-21-15 0321 .  ondansetron (ZOFRAN) injection 4 mg, 4 mg, Intravenous, Q6H PRN, Sherren Mocha,  MD .  pantoprazole (PROTONIX) injection 40 mg, 40 mg, Intravenous, QHS, Rahul P Desai, PA-C, 40 mg at 09/23/15 2111 .  phenylephrine (NEO-SYNEPHRINE) 40 mg in dextrose 5 % 250 mL (0.16 mg/mL) infusion, 0-400 mcg/min, Intravenous, Titrated, Jose Angelo A de [place], MD, Last Rate: 37.5 mL/hr at 10-16-2015 0322, 100 mcg/min at 2015/10/16 0322 .  propofol (DIPRIVAN) 1000 MG/100ML infusion, 5-80 mcg/kg/min, Intravenous, Continuous,  Rahul P Desai, PA-C, Stopped at 09/23/15 1100 .  sodium bicarbonate 150 mEq in sterile water 1,000 mL infusion, , Intravenous, Continuous, Luz Brazen, MD, Last Rate: 100 mL/hr at 10-16-2015 0456 .  sodium bicarbonate injection 100 mEq, 100 mEq, Intravenous, Once, Colbert Coyer, MD, 100 mEq at 09/23/15 2225 .  sodium chloride flush (NS) 0.9 % injection 10-40 mL, 10-40 mL, Intracatheter, Q12H, Praveen Mannam, MD, 10 mL at 10-16-15 0224 .  sodium chloride flush (NS) 0.9 % injection 10-40 mL, 10-40 mL, Intracatheter, PRN, Praveen Mannam, MD .  ticagrelor (BRILINTA) tablet 90 mg, 90 mg, Oral, BID, Sherren Mocha, MD, 90 mg at 09/23/15 1939 .  vasopressin (PITRESSIN) 40 Units in sodium chloride 0.9 % 250 mL (0.16 Units/mL) infusion, 0.03 Units/min, Intravenous, Continuous, Jose Angelo A de [place], MD, Last Rate: 11.3 mL/hr at 09/23/15 1300, 0.03 Units/min at 09/23/15 1300   Introduction:  This is a 19 channel routine scalp EEG performed at the bedside with bipolar and monopolar montages arranged in accordance to the international 10/20 system of electrode placement. One channel was dedicated to EKG recording.   Findings:  Severely suppressed background amplitude is noted, with no clearly discernible background activity on the routine sensitivity settings. Using very high sensitivity setting of 1 microvolt/mm, intermittent background activity was discernible of about 6-7 Hz frequency. EKG artifacts noted. No definite evidence of abnormal epileptiform discharges or electrographic seizures were noted during this recording.   Impression:  Abnormal routine inpatient EEG with severely suppressed background activity on the routine sensitivity settings as described, with some intermittent cortical activity seen on high sensitivity setting. 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 No abnormal epileptiform discharges or electrographic seizures were noted during this recording. Consider repeating the EEG when off of the sedative medications.  Clinical correlation is recommended .

## 2015-10-24 NOTE — Progress Notes (Signed)
S: Called be e-link to evaluate pt w/ worsening hypotension  O: Found pt maxed on four pressors, hypotensive and tachycardic. Exam unchanged from doumented.  A/P: Agree w/ seeking to provide/maintain normothermia. BP improved some with his and pH control. Long family discussions had on goals of care. Pt's sons requested full code, despite DNR being reccommended. All family members updated.  Luz Brazen, MD Pulmonary & Critical Care Medicine 2015/10/17, 3:26 AM   CRITICAL CARE Performed by: Luz Brazen   Total critical care time: 60 minutes  Critical care time was exclusive of separately billable procedures and treating other patients.  Critical care was necessary to treat or prevent imminent or life-threatening deterioration.  Critical care was time spent personally by me on the following activities: development of treatment plan with patient and/or surrogate as well as nursing, discussions with consultants, evaluation of patient's response to treatment, examination of patient, obtaining history from patient or surrogate, ordering and performing treatments and interventions, ordering and review of laboratory studies, ordering and review of radiographic studies, pulse oximetry and re-evaluation of patient's condition.

## 2015-10-24 NOTE — Progress Notes (Signed)
   10-07-2015 1500  Clinical Encounter Type  Visited With Family  Visit Type Death  Spiritual Encounters  Spiritual Needs Grief support;Emotional  Ch called to meet family following pt death; [place] aided in facilitating for memory kit and removal of vent for family to see pt without leads & vent; [place] offered spiritual and grief support; family grieving and seem to be in shock; [place] provided patient placement card; if needed, please page [facility] 3:21 PM Gwynn Burly

## 2015-10-24 DEATH — deceased

## 2016-05-31 IMAGING — CR DG CHEST 1V PORT
1 series · 1 of 1 positions shown · non-contrast
Comparison: None.

CLINICAL DATA: Endotracheal tube and central line placement.
Initial encounter.

EXAM:
PORTABLE CHEST 1 VIEW

[AP]
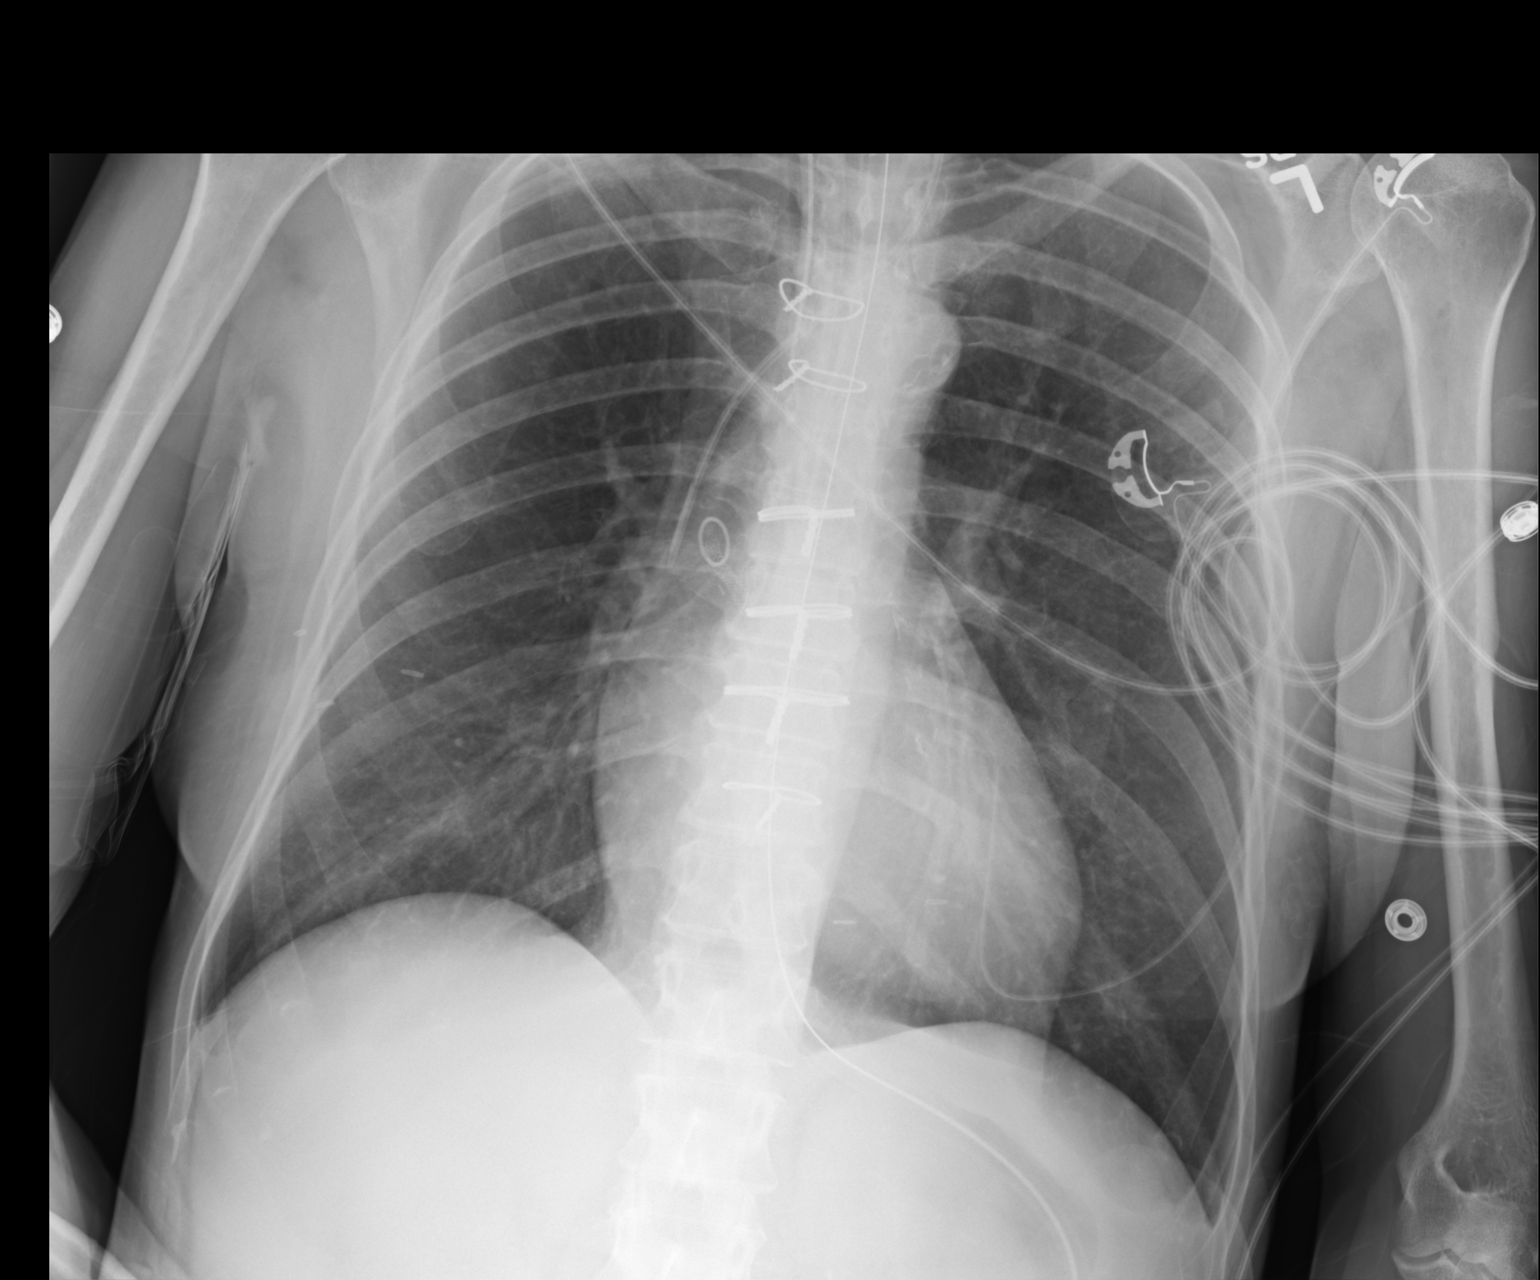

[1 of 1 positions shown; findings below may reference images not displayed]

FINDINGS: The patient's endotracheal tube is seen ending 3-4 cm above the
carina. A left IJ line is noted ending about the mid SVC. An enteric
tube is noted extending below the diaphragm.

The lungs are well-aerated and clear. There is no evidence of focal
opacification, pleural effusion or pneumothorax.

The cardiomediastinal silhouette is normal in size. The patient is
status post median sternotomy, with evidence of prior CABG. No acute
osseous abnormalities are seen.
IMPRESSION: 1. Endotracheal tube seen ending 3-4 cm above the carina.
2. Left IJ line noted ending about the mid SVC.
3. Enteric tube seen extending below the diaphragm.
4. No acute cardiopulmonary process seen.

## 2017-04-05 IMAGING — US US PELVIS COMPLETE
1 series · 14 of 25 positions shown · non-contrast
Comparison: CT 06/26/2015.

CLINICAL DATA: Right lower quadrant pain .

EXAM:
TRANSABDOMINAL AND TRANSVAGINAL ULTRASOUND OF PELVIS
TECHNIQUE: Both transabdominal and transvaginal ultrasound examinations of the
pelvis were performed. Transabdominal technique was performed for
global imaging of the pelvis including uterus, ovaries, adnexal
regions, and pelvic cul-de-sac. It was necessary to proceed with
endovaginal exam following the transabdominal exam to visualize the
uterus and ovary.

[Series 1: us pelvis complete · 0.20mm/px · 14 of 51 slices shown]
[im 1/51]
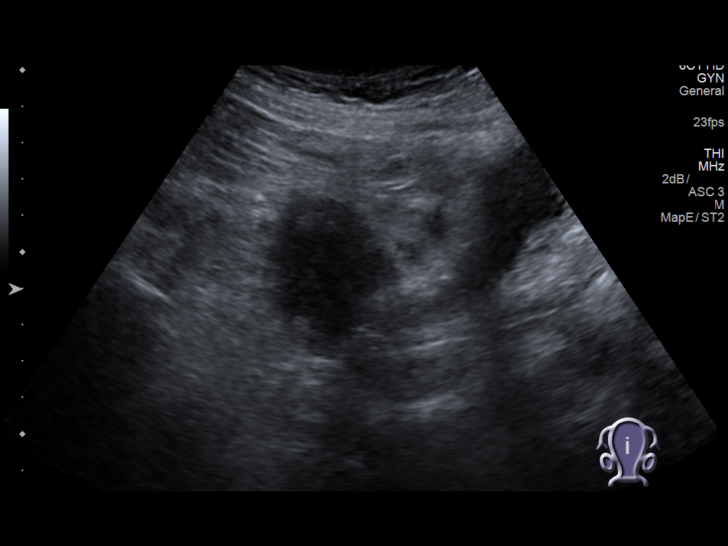
[im 5/51]
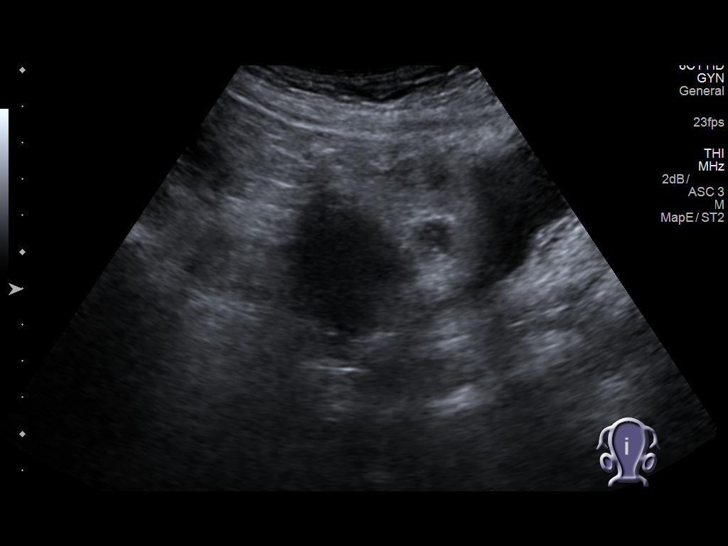
[im 9/51]
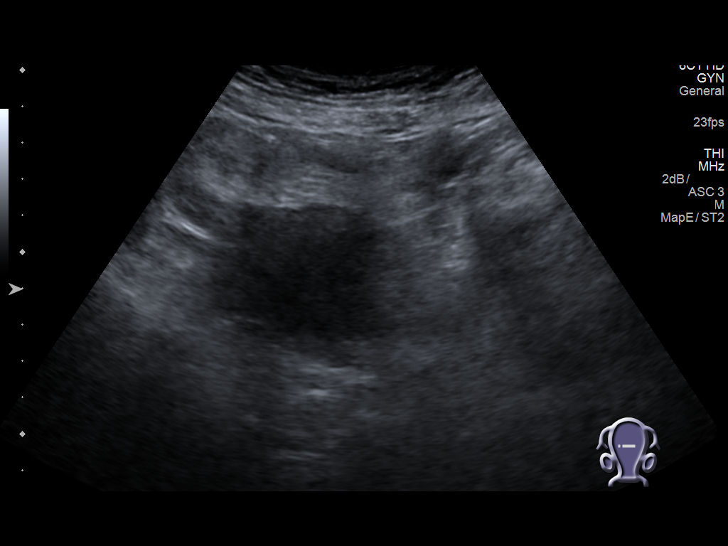
[im 13/51]
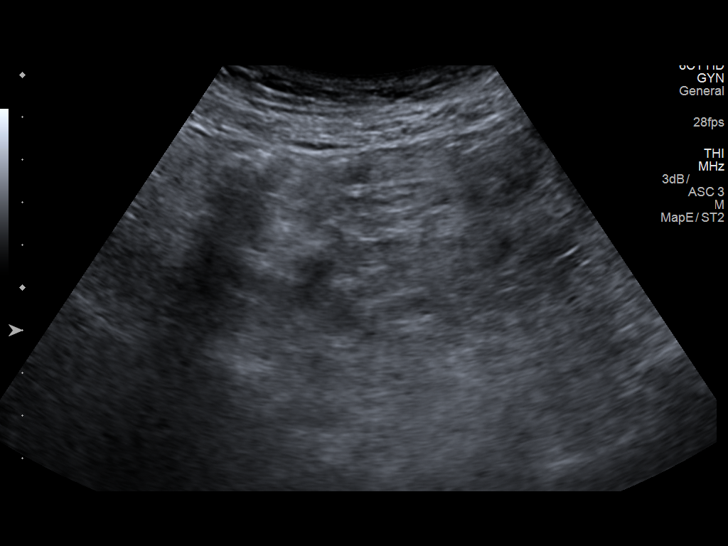
[im 17/51]
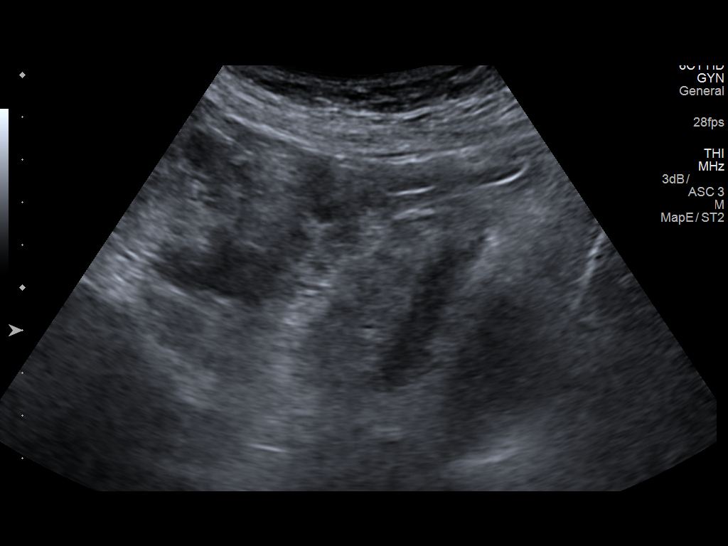
[im 19/51]
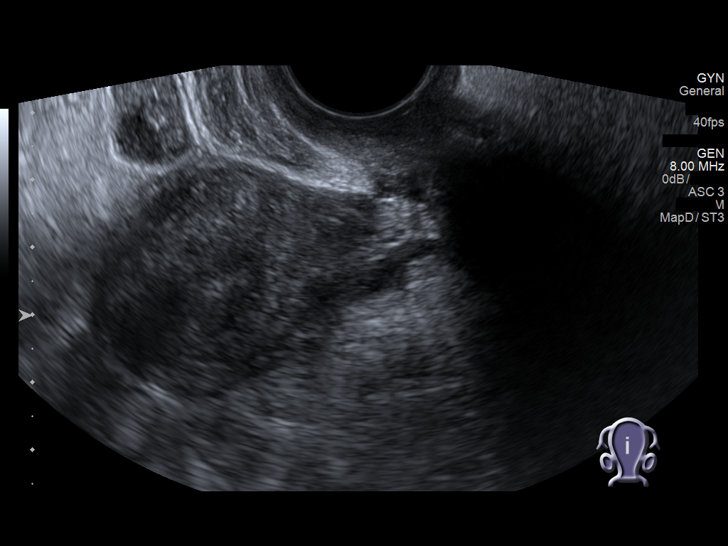
[im 23/51]
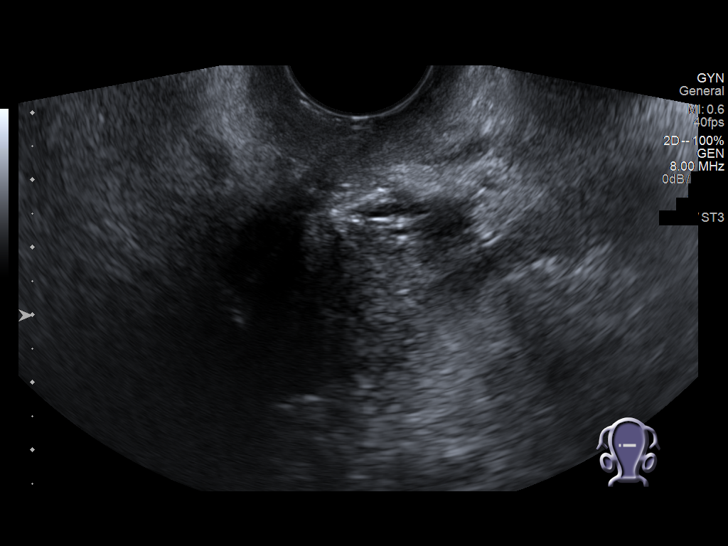
[im 28/51]
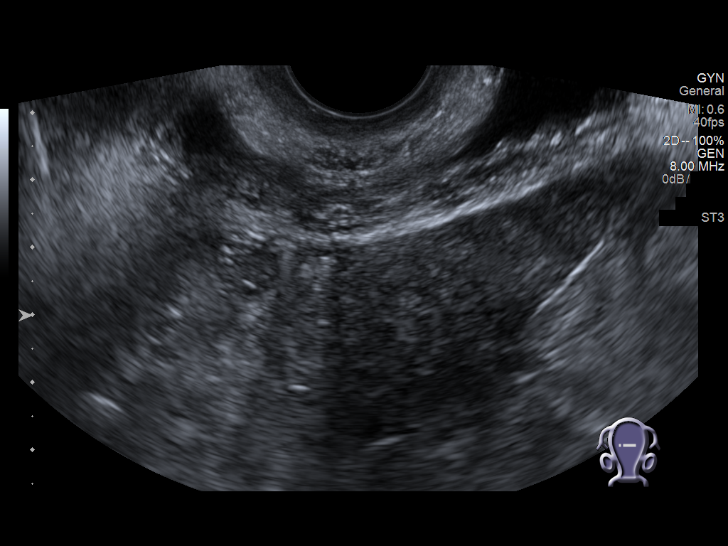
[im 32/51]
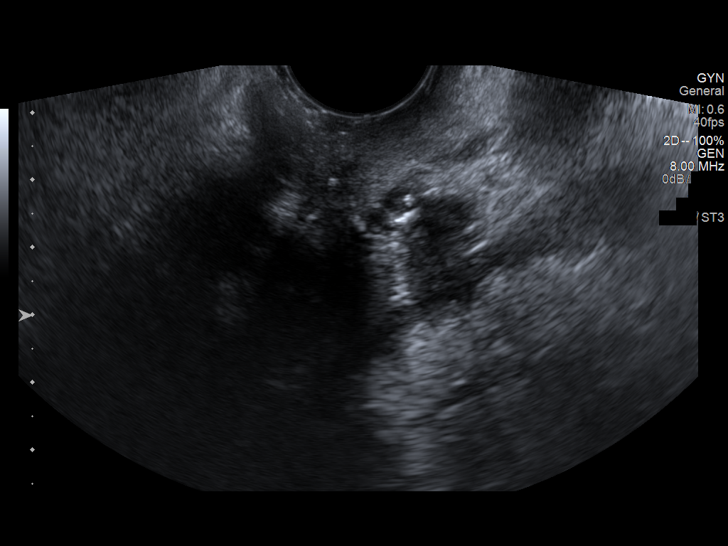
[im 34/51]
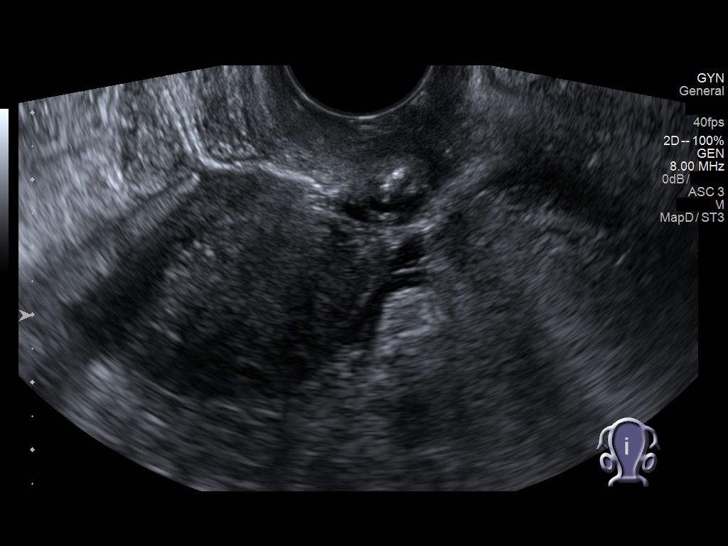
[im 38/51]
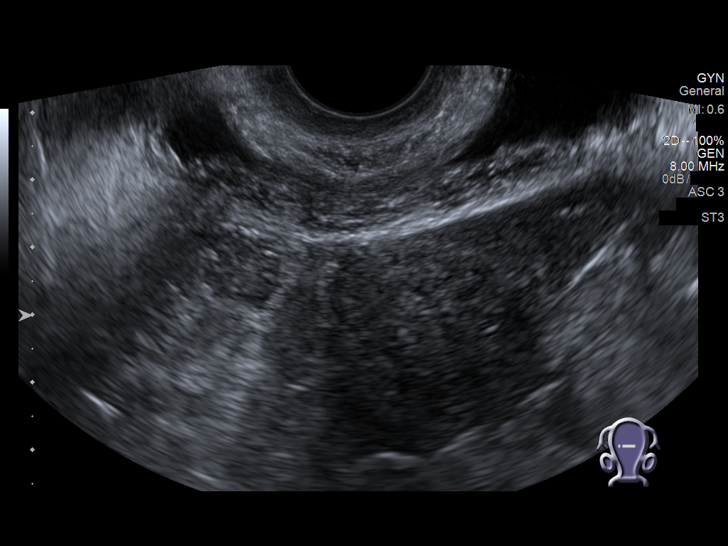
[im 42/51]
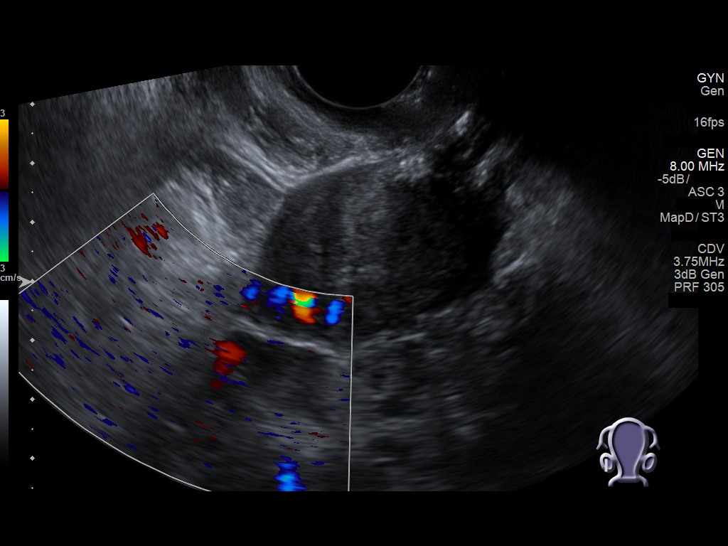
[im 46/51]
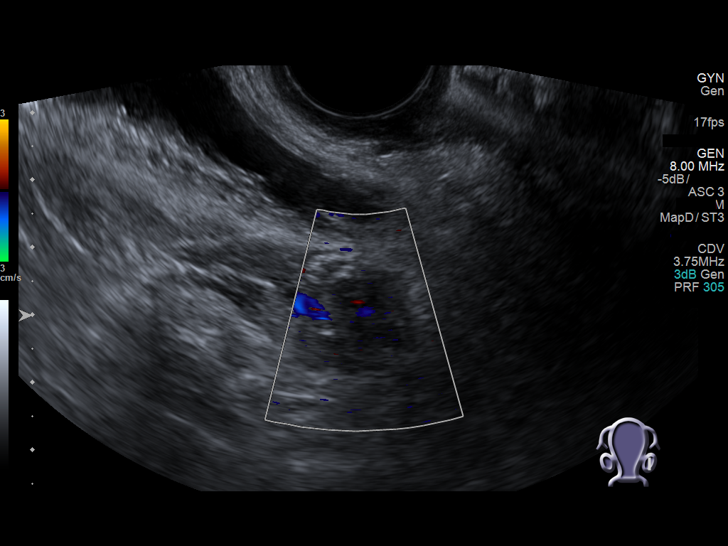
[im 51/51]
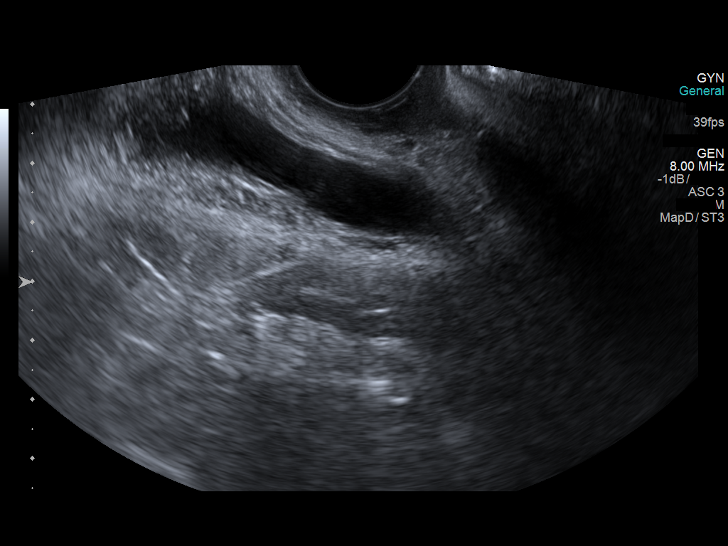

[14 of 25 positions shown; findings below may reference images not displayed]

FINDINGS: Uterus

Measurements: 7.4 x 3.4 x 4.2 cm. No fibroids or other mass
visualized.

Endometrium

Thickness: 7 mm.  No focal abnormality visualized.

Right ovary

Measurements: 2.1 x 1.4 x 1.4 cm. Normal appearance/no adnexal mass.

Left ovary

Measurements: 1.6 x 1.7 x 1.3 cm. Normal appearance/no adnexal mass.

Other findings

No abnormal free fluid.
IMPRESSION: No acute or focal abnormality.
